# Patient Record
Sex: Male | Born: 2000 | Race: White | Hispanic: No | Marital: Single | Smoking: Never smoker
Health system: Southern US, Community
[De-identification: ages and names within clinical notes are randomized; demographics above are authoritative.]

## PROBLEM LIST (undated history)

## (undated) DIAGNOSIS — J45909 Unspecified asthma, uncomplicated: Secondary | ICD-10-CM

## (undated) DIAGNOSIS — R51 Headache: Secondary | ICD-10-CM

## (undated) DIAGNOSIS — G43909 Migraine, unspecified, not intractable, without status migrainosus: Secondary | ICD-10-CM

## (undated) DIAGNOSIS — K219 Gastro-esophageal reflux disease without esophagitis: Secondary | ICD-10-CM

## (undated) DIAGNOSIS — R109 Unspecified abdominal pain: Secondary | ICD-10-CM

## (undated) DIAGNOSIS — K589 Irritable bowel syndrome without diarrhea: Secondary | ICD-10-CM

## (undated) HISTORY — DX: Headache: R51

## (undated) HISTORY — DX: Migraine, unspecified, not intractable, without status migrainosus: G43.909

## (undated) HISTORY — DX: Unspecified asthma, uncomplicated: J45.909

## (undated) HISTORY — PX: NO PAST SURGERIES: SHX2092

## (undated) HISTORY — DX: Unspecified abdominal pain: R10.9

## (undated) HISTORY — DX: Irritable bowel syndrome, unspecified: K58.9

---
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
## 2018-02-11 ENCOUNTER — Ambulatory Visit: Payer: 59 | Admitting: Neurology

## 2018-02-11 ENCOUNTER — Encounter: Payer: Self-pay | Admitting: Neurology

## 2018-02-11 VITALS — BP 112/73 | HR 80 | Ht 73.0 in | Wt 175.4 lb

## 2018-02-11 DIAGNOSIS — G43009 Migraine without aura, not intractable, without status migrainosus: Secondary | ICD-10-CM

## 2018-02-11 MED ORDER — TIZANIDINE HCL 4 MG PO TABS: 4.0000 mg | ORAL_TABLET | Freq: Four times a day (QID) | ORAL | 6 refills | Status: DC | PRN

## 2018-02-11 MED ORDER — BUTALBITAL-APAP-CAFFEINE 50-325-40 MG PO TABS: 2.0000 | ORAL_TABLET | Freq: Four times a day (QID) | ORAL | 3 refills | Status: DC | PRN

## 2018-02-11 MED ORDER — ONDANSETRON 4 MG PO TBDP: 4.0000 mg | ORAL_TABLET | Freq: Three times a day (TID) | ORAL | 3 refills | Status: DC | PRN

## 2018-02-11 NOTE — Patient Instructions (Addendum)
Tizanidine up to 3x a day for migraine or neck spasms. Or may take at bedtime every night. Zofran (ondansetron) for nausea or migraine Fioricet at onset of migraine  Ondansetron tablets What is this medicine? ONDANSETRON (on DAN se tron) is used to treat nausea and vomiting caused by chemotherapy. It is also used to prevent or treat nausea and vomiting after surgery. This medicine may be used for other purposes; ask your health care provider or pharmacist if you have questions. COMMON BRAND NAME(S): Zofran What should I tell my health care provider before I take this medicine? They need to know if you have any of these conditions: -heart disease -history of irregular heartbeat -liver disease -low levels of magnesium or potassium in the blood -an unusual or allergic reaction to ondansetron, granisetron, other medicines, foods, dyes, or preservatives -pregnant or trying to get pregnant -breast-feeding How should I use this medicine? Take this medicine by mouth with a glass of water. Follow the directions on your prescription label. Take your doses at regular intervals. Do not take your medicine more often than directed. Talk to your pediatrician regarding the use of this medicine in children. Special care may be needed. Overdosage: If you think you have taken too much of this medicine contact a poison control center or emergency room at once. NOTE: This medicine is only for you. Do not share this medicine with others. What if I miss a dose? If you miss a dose, take it as soon as you can. If it is almost time for your next dose, take only that dose. Do not take double or extra doses. What may interact with this medicine? Do not take this medicine with any of the following medications: -apomorphine -certain medicines for fungal infections like fluconazole, itraconazole, ketoconazole, posaconazole,  voriconazole -cisapride -dofetilide -dronedarone -pimozide -thioridazine -ziprasidone This medicine may also interact with the following medications: -carbamazepine -certain medicines for depression, anxiety, or psychotic disturbances -fentanyl -linezolid -MAOIs like Carbex, Eldepryl, Marplan, Nardil, and Parnate -methylene blue (injected into a vein) -other medicines that prolong the QT interval (cause an abnormal heart rhythm) -phenytoin -rifampicin -tramadol This list may not describe all possible interactions. Give your health care provider a list of all the medicines, herbs, non-prescription drugs, or dietary supplements you use. Also tell them if you smoke, drink alcohol, or use illegal drugs. Some items may interact with your medicine. What should I watch for while using this medicine? Check with your doctor or health care professional right away if you have any sign of an allergic reaction. What side effects may I notice from receiving this medicine? Side effects that you should report to your doctor or health care professional as soon as possible: -allergic reactions like skin rash, itching or hives, swelling of the face, lips or tongue -breathing problems -confusion -dizziness -fast or irregular heartbeat -feeling faint or lightheaded, falls -fever and chills -loss of balance or coordination -seizures -sweating -swelling of the hands or feet -tightness in the chest -tremors -unusually weak or tired Side effects that usually do not require medical attention (report to your doctor or health care professional if they continue or are bothersome): -constipation or diarrhea -headache This list may not describe all possible side effects. Call your doctor for medical advice about side effects. You may report side effects to FDA at 1-800-FDA-1088. Where should I keep my medicine? Keep out of the reach of children. Store between 2 and 30 degrees C (36 and 86 degrees F).  Throw away any  unused medicine after the expiration date. NOTE: This sheet is a summary. It may not cover all possible information. If you have questions about this medicine, talk to your doctor, pharmacist, or health care provider.  2018 [employer]vier/Gold Standard (2012-12-17 16:27:45)    Tizanidine tablets or capsules What is this medicine? TIZANIDINE (tye ZAN i deen) helps to relieve muscle spasms. It may be used to help in the treatment of multiple sclerosis and spinal cord injury. This medicine may be used for other purposes; ask your health care provider or pharmacist if you have questions. COMMON BRAND NAME(S): Zanaflex What should I tell my health care provider before I take this medicine? They need to know if you have any of these conditions: -kidney disease -liver disease -low blood pressure -mental disorder -an unusual or allergic reaction to tizanidine, other medicines, lactose (tablets only), foods, dyes, or preservatives -pregnant or trying to get pregnant -breast-feeding How should I use this medicine? Take this medicine by mouth with a full glass of water. Take this medicine on an empty stomach, at least 30 minutes before or 2 hours after food. Do not take with food unless you talk with your doctor. Follow the directions on the prescription label. Take your medicine at regular intervals. Do not take your medicine more often than directed. Do not stop taking except on your doctor's advice. Suddenly stopping the medicine can be very dangerous. Talk to your pediatrician regarding the use of this medicine in children. Patients over 26 years old may have a stronger reaction and need a smaller dose. Overdosage: If you think you have taken too much of this medicine contact a poison control center or emergency room at once. NOTE: This medicine is only for you. Do not share this medicine with others. What if I miss a dose? If you miss a dose, take it as soon as you can. If it is almost  time for your next dose, take only that dose. Do not take double or extra doses. What may interact with this medicine? Do not take this medicine with any of the following medications: -ciprofloxacin -cisapride -dofetilide -dronedarone -fluvoxamine -narcotic medicines for cough -pimozide -thiabendazole -thioridazine -ziprasidone This medicine may also interact with the following medications: -acyclovir -alcohol -antihistamines for allergy, cough and cold -baclofen -certain antibiotics like levofloxacin, ofloxacin -certain medicines for anxiety or sleep -certain medicines for blood pressure, heart disease, irregular heart beat -certain medicines for depression like amitriptyline, fluoxetine, sertraline -certain medicines for seizures like phenobarbital, primidone -certain medicines for stomach problems like cimetidine, famotidine -male hormones, like estrogens or progestins and birth control pills, patches, rings, or injections -general anesthetics like halothane, isoflurane, methoxyflurane, propofol -local anesthetics like lidocaine, pramoxine, tetracaine -medicines that relax muscles for surgery -narcotic medicines for pain -other medicines that prolong the QT interval (cause an abnormal heart rhythm) -phenothiazines like chlorpromazine, mesoridazine, prochlorperazine -ticlopidine -zileuton This list may not describe all possible interactions. Give your health care provider a list of all the medicines, herbs, non-prescription drugs, or dietary supplements you use. Also tell them if you smoke, drink alcohol, or use illegal drugs. Some items may interact with your medicine. What should I watch for while using this medicine? Tell your doctor or health care professional if your symptoms do not start to get better or if they get worse. You may get drowsy or dizzy. Do not drive, use machinery, or do anything that needs mental alertness until you know how this medicine affects you. Do  not stand or sit up  quickly, especially if you are an older patient. This reduces the risk of dizzy or fainting spells. Alcohol may interfere with the effect of this medicine. Avoid alcoholic drinks. If you are taking another medicine that also causes drowsiness, you may have more side effects. Give your health care provider a list of all medicines you use. Your doctor will tell you how much medicine to take. Do not take more medicine than directed. Call emergency for help if you have problems breathing or unusual sleepiness. Your mouth may get dry. Chewing sugarless gum or sucking hard candy, and drinking plenty of water may help. Contact your doctor if the problem does not go away or is severe. What side effects may I notice from receiving this medicine? Side effects that you should report to your doctor or health care professional as soon as possible: -allergic reactions like skin rash, itching or hives, swelling of the face, lips, or tongue -breathing problems -hallucinations -signs and symptoms of liver injury like dark yellow or brown urine; general ill feeling or flu-like symptoms; light-colored stools; loss of appetite; nausea; right upper quadrant belly pain; unusually weak or tired; yellowing of the eyes or skin -signs and symptoms of low blood pressure like dizziness; feeling faint or lightheaded, falls; unusually weak or tired -unusually slow heartbeat -unusually weak or tired Side effects that usually do not require medical attention (report to your doctor or health care professional if they continue or are bothersome): -blurred vision -constipation -dizziness -dry mouth -tiredness This list may not describe all possible side effects. Call your doctor for medical advice about side effects. You may report side effects to FDA at 1-800-FDA-1088. Where should I keep my medicine? Keep out of the reach of children. Store at room temperature between 15 and 30 degrees C (59 and 86 degrees  F). Throw away any unused medicine after the expiration date. NOTE: This sheet is a summary. It may not cover all possible information. If you have questions about this medicine, talk to your doctor, pharmacist, or health care provider.  2018 [employer]vier/Gold Standard (2014-12-21 13:52:12)

## 2018-02-11 NOTE — Progress Notes (Signed)
[facility]    Provider:  Dr Lucia Gaskins Referring Provider: Nonnie Done., MD Primary Care Physician:  Nonnie Done., MD  CC: Migraines  Interval history 02/11/2018: Patient is here with his mother.  Mother provides much information.  Patient has migraines.  Patient felt the nortriptyline made his migraines worse.  He had a reaction to atrophy or possibly just anxiety, he does not like needles, he declines any further CGRP's or Botox or any other medications or procedures involving needles.  He also had a very bad reaction to the Imitrex and does not want to take triptans anymore.  I gave him a limited supply of Fioricet, discussed medication overuse headache.  He feels his headache frequency is not bad enough to try another preventative, discussed likely topiramate would be the next thing we could try or Zonegran since his mom had a bad reaction to topiramate for her migraines.  At this point he is okay as is with the Fioricet as needed.  HPI:  Stephen Bass is a 17 y.o. male here as a referral from Dr. Egbert Garibaldi for migraines. PMHx migraines. Here with his mother who provides information. The headaches started after the concussions. He hit his head on the back left. Twice while playing basketball. There was some confusion but no loss of consciousness and quickly resolved. Last was 03/2015 and he started having migraines, started in the back of the head, like a hammer, pounding, pulsating not necessarily burning or shooting or tingling, light and sound sensitivity, nausea, his muscles in the back of his neck hurts but later diagnosed as swelled lymph nodes. They have lasted up to 2 days. Average >4 hours. Sensitivity tot he skull. Unknown triggers, have kept a migraine diary and no patterns. 15 headache days a month. 8 migrainous. They are acute and severe. No Aura. He will get associated finger numbness. He goes to [facility]. Impairs his life. Maybe a trigger is neck position looking  down a lot neck muscles hurt. Sleep helps.  maxalt doesn't help. He takes 2 ibuprofen, a benadryl, and a nausea pill. No medication overuse.   Reviewed notes, labs and imaging from outside physicians, which showed:  Reviewed referring physician notes.  17 year old presents complaining of headache with history of headaches.  Last headache lasted 3 days and was 1 month ago.  He has been on visit trip 10 prescribed by his neurologist but he says it is not helped his headaches.  He has no aura or photophobia no visual changes he has photophobia headache is throbbing pounding always occurs in the occipital area.  He has no premonition of headache.  Asked for a new neurologist.  Medications tried: Topiramate which increased headaches, Flexeril, ibuprofen, Excedrin, Benadryl, Phenergan, Maxalt  Reviewed neurologist notes.  Patient was seeing Dr. Clelia Croft.  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  They performed occipital nerve blocks.  It appears that they have recommended Maxalt in the past and magnesium.  The headache started in 2007 was pounding, squeezing, throbbing, constant pressure, exploding pain in the occipital region.  He gets light sensitivity and noise sensitivity, headaches reach its worst after onset quickly.  Headaches may last a variable time.  Worsens with physical activity.  Activity triggers the headaches.  Sleeping in a dark room helps.  Patient is seen Dr. Sharene Skeans for symptoms.  Patient has been to the emergency room for this.  He does not suffer from depression, obstructive sleep apnea, obesity.  He  does suffer from anxiety.  He has a history of head trauma and concussions.  Last concussion was in January 2017.  Since then his headaches have worsened and are more frequent.  He sleeps from 10-8 nightly.  Family history of headaches in the mother.  Patient tried meloxicam as well.  MRI brain 08/2015: showed No acute intracranial abnormalities including  mass lesion or mass effect, hydrocephalus, extra-axial fluid collection, midline shift, hemorrhage, or acute infarction, large ischemic events (personally reviewed images)    Review of Systems: Patient complains of symptoms per HPI as well as the following symptoms:Headache, numbness, weakness . Pertinent negatives and positives per HPI. All others negative.   Social History   Socioeconomic History  . Marital status: Single    Spouse name: Not on file  . Number of children: Not on file  . Years of education: Archivist   . Highest education level: Some college, no degree  Occupational History  . Not on file  Social Needs  . Financial resource strain: Not on file  . Food insecurity:    Worry: Not on file    Inability: Not on file  . Transportation needs:    Medical: Not on file    Non-medical: Not on file  Tobacco Use  . Smoking status: Never Smoker  . Smokeless tobacco: Never Used  Substance and Sexual Activity  . Alcohol use: No  . Drug use: No  . Sexual activity: Never  Lifestyle  . Physical activity:    Days per week: Not on file    Minutes per session: Not on file  . Stress: Not on file  Relationships  . Social connections:    Talks on phone: Not on file    Gets together: Not on file    Attends religious service: Not on file    Active member of club or organization: Not on file    Attends meetings of clubs or organizations: Not on file    Relationship status: Not on file  . Intimate partner violence:    Fear of current or ex partner: Not on file    Emotionally abused: Not on file    Physically abused: Not on file    Forced sexual activity: Not on file  Other Topics Concern  . 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 He lives with his parents and sibling. He enjoys basketball and running.   Caffeine: 2 cups of tea daily   Right handed    Family History  Problem Relation Age of Onset  . COPD Paternal  Grandfather        Died at the age of 39  . Migraines Mother   . Endometriosis Mother   . Hyperlipidemia Father   . Migraines Paternal Grandmother     Past Medical History:  Diagnosis Date  . Abdominal pain   . Acid reflux   . Asthma   . Headache(784.0)   . IBS (irritable bowel syndrome)   . Migraine     Past Surgical History:  Procedure Laterality Date  . NO PAST SURGERIES    . WISDOM TOOTH EXTRACTION Bilateral 10/24/2017    Current Outpatient Medications  Medication Sig Dispense Refill  . albuterol (PROVENTIL HFA;VENTOLIN HFA) 108 (90 Base) MCG/ACT inhaler Inhale 2 puffs into the lungs. Use as needed for exercise induced asthma     . butalbital-acetaminophen-caffeine (FIORICET, ESGIC) 50-325-40 MG tablet Take 2 tablets by mouth every  6 (six) hours as needed for headache. 20 tablet 3  . dicyclomine (BENTYL) 10 MG capsule Take 10 mg by mouth 3 (three) times daily as needed.    . fexofenadine (ALLEGRA) 60 MG tablet Take 60 mg by mouth daily.    Marland Kitchen eletriptan (RELPAX) 40 MG tablet Take 1 tablet (40 mg total) by mouth as needed for migraine or headache. Repeat in 2 hours if needed. Max twice in one day. (Patient not taking: Reported on 02/11/2018) 10 tablet 11  . nortriptyline (PAMELOR) 10 MG capsule Take 1 capsule (10 mg total) by mouth at bedtime. (Patient not taking: Reported on 02/11/2018) 30 capsule 11  . ondansetron (ZOFRAN-ODT) 4 MG disintegrating tablet Take 1 tablet (4 mg total) by mouth every 8 (eight) hours as needed for nausea. 30 tablet 3  . tiZANidine (ZANAFLEX) 4 MG tablet Take 1 tablet (4 mg total) by mouth every 6 (six) hours as needed for muscle spasms. 90 tablet 6   No current facility-administered medications for this visit.     Allergies as of 02/11/2018 - Review Complete 02/11/2018  Allergen Reaction Noted  . Amoxicillin-pot clavulanate Diarrhea 03/23/2013    Vitals: BP 112/73   Pulse 80   Ht 6\' 1"  (1.854 m)   Wt 175 lb 6.4 oz (79.6 kg)   BMI 23.14  kg/m  Last Weight:  Wt Readings from Last 1 Encounters:  02/11/18 175 lb 6.4 oz (79.6 kg) (79 %, Z= 0.80)*   * Growth percentiles are based on CDC (Boys, 2-20 Years) data.   Last Height:   Ht Readings from Last 1 Encounters:  02/11/18 6\' 1"  (1.854 m) (89 %, Z= 1.24)*   * Growth percentiles are based on CDC (Boys, 2-20 Years) data.   Physical exam: Exam: Gen: NAD, conversant, well nourised, well groomed                     CV: RRR, no MRG. No Carotid Bruits. No peripheral edema, warm, nontender Eyes: Conjunctivae clear without exudates or hemorrhage  Neuro: Detailed Neurologic Exam  Speech:    Speech is normal; fluent and spontaneous with normal comprehension.  Cognition:    The patient is oriented to person, place, and time;     recent and remote memory intact;     language fluent;     normal attention, concentration,     fund of knowledge Cranial Nerves:    The pupils are equal, round, and reactive to light. The fundi are normal and spontaneous venous pulsations are present. Visual fields are full to finger confrontation. Extraocular movements are intact. Trigeminal sensation is intact and the muscles of mastication are normal. The face is symmetric. The palate elevates in the midline. Hearing intact. Voice is normal. Shoulder shrug is normal. The tongue has normal motion without fasciculations.   Coordination:    Normal finger to nose and heel to shin. Normal rapid alternating movements.   Gait:    Heel-toe and tandem gait are normal.   Motor Observation:    No asymmetry, no atrophy, and no involuntary movements noted. Tone:    Normal muscle tone.    Posture:    Posture is normal. normal erect    Strength:    Strength is V/V in the upper and lower limbs.      Sensation: intact to LT     Reflex Exam:  DTR's:    Deep tendon reflexes in the upper and lower extremities are normal bilaterally.   Toes:  The toes are downgoing bilaterally.   Clonus:    Clonus  is absent.       Assessment/Plan:  Ptient with migraines. Had a long discussion about migraine management.    - Patient felt the nortriptyline made his migraines worse.   - He had a reaction to atrophy or possibly just anxiety, he does not like needles, he declines any further CGRP's or Botox or any other medications or procedures involving needles.   - He also had side effects to the Imitrex and maxalt and does not want to take triptans anymore. - Fioricet helps. I gave him a limited monthly supply of Fioricet, discussed medication overuse headache.   - He feels his headache frequency is not bad enough to try another preventative, discussed likely topiramate would be the next thing we could try or Zonegran since his mom had a bad reaction to topiramate for her migraines.   - At this point he is okay as is with the Fioricet as needed. -Headaches are often occipital, started after concussion, discussed the occipital nerve and its role in headaches and migraines.  At some point could try nerve blocks however patient declines anything that involves a needle. - Tizanidine up to 3x a day for migraine or neck spasms, neck muscle pain which he has. Or may take at bedtime every night. Discussed stretching, good posture. They decline PT. - Zofran (ondansetron) for nausea or migraine  Meds ordered this encounter  Medications  . tiZANidine (ZANAFLEX) 4 MG tablet    Sig: Take 1 tablet (4 mg total) by mouth every 6 (six) hours as needed for muscle spasms.    Dispense:  90 tablet    Refill:  6  . butalbital-acetaminophen-caffeine (FIORICET, ESGIC) 50-325-40 MG tablet    Sig: Take 2 tablets by mouth every 6 (six) hours as needed for headache.    Dispense:  20 tablet    Refill:  3    Do not fill early  . ondansetron (ZOFRAN-ODT) 4 MG disintegrating tablet    Sig: Take 1 tablet (4 mg total) by mouth every 8 (eight) hours as needed for nausea.    Dispense:  30 tablet    Refill:  3     Again,  Discussed: To prevent or relieve headaches, try the following: Cool Compress. Lie down and place a cool compress on your head.  Avoid headache triggers. If certain foods or odors seem to have triggered your migraines in the past, avoid them. A headache diary might help you identify triggers.  Include physical activity in your daily routine. Try a daily walk or other moderate aerobic exercise.  Manage stress. Find healthy ways to cope with the stressors, such as delegating tasks on your to-do list.  Practice relaxation techniques. Try deep breathing, yoga, massage and visualization.  Eat regularly. Eating regularly scheduled meals and maintaining a healthy diet might help prevent headaches. Also, drink plenty of fluids.  Follow a regular sleep schedule. Sleep deprivation might contribute to headaches Consider biofeedback. With this mind-body technique, you learn to control certain bodily functions - such as muscle tension, heart rate and blood pressure - to prevent headaches or reduce headache pain.    Proceed to emergency room if you experience new or worsening symptoms or symptoms do not resolve, if you have new neurologic symptoms or if headache is severe, or for any concerning symptom.   Provided education and documentation from American headache Society toolbox including articles on: chronic migraine medication overuse headache, chronic  migraines, prevention of migraines, behavioral and other nonpharmacologic treatments for headache.  A total of 25 minutes was spent face-to-face with this patient. Over half this time was spent on counseling patient on the  1. Migraine without aura and without status migrainosus, not intractable     diagnosis and different diagnostic and therapeutic options, counseling and coordination of care, risks ans benefits of management, compliance, or risk factor reduction and education.       Naomie DeanAntonia Delenn Ahn, MD  [facility] [address]  [address] [place], [place] [id number]  Phone [phone] Fax [phone]

## 2018-09-16 ENCOUNTER — Telehealth: Payer: Self-pay | Admitting: Neurology

## 2018-09-16 ENCOUNTER — Encounter: Payer: Self-pay | Admitting: Neurology

## 2018-09-16 NOTE — Telephone Encounter (Signed)
Pts mother Farooq Petrovich called in and stated pt is having side effects from the butalbital-acetaminophen-caffeine (FIORICET, ESGIC) 50-325-40 MG tablet, she states he had a episode where he wet the bed due to being out of it, and also has been experiencing some dizziness .

## 2018-09-16 NOTE — Telephone Encounter (Signed)
Stop the fioricet. Call the pharmacy and tell them not to refill.  Set up a video appointment with amy.

## 2018-09-16 NOTE — Telephone Encounter (Signed)
Spoke with pt's mother. She stated pt has lost 20 lbs. The last time he took the [place] I spoke with the pt.  He stated the last three times he has taken the Fioricet he has had side effects but had only been taking 1 tablet every 6 hours as needed. He stated he took it on Friday and then repeated the dose around 11 pm that night and the s/e got worse. S/e have resolved. They lasted for about a whole day.

## 2018-09-16 NOTE — Telephone Encounter (Signed)
Spoke with Cassandra @ [facility]. Fioricet canceled.   Spoke with pt and discussed plan to stop Fioricet. Discussed doing a VV. Patient agreed. Pt understands that although there may be some limitations with this type of visit, we will take all precautions to reduce any security or privacy concerns. Pt understands that this will be treated like an in office visit and we will file with pt's insurance, and there may be a patient responsible charge related to this service. Scheduled pt for mychart VV on Wed 6/24 @ 3:30 pm. His questions were answered. He understands the process for joining meeting. He also confirmed his DOB and provided updates to chart. No changes to history. He verbalized appreciation for the call.

## 2018-09-17 ENCOUNTER — Other Ambulatory Visit: Payer: Self-pay

## 2018-09-17 ENCOUNTER — Encounter: Payer: Self-pay | Admitting: Family Medicine

## 2018-09-17 ENCOUNTER — Telehealth (INDEPENDENT_AMBULATORY_CARE_PROVIDER_SITE_OTHER): Payer: 59 | Admitting: Family Medicine

## 2018-09-17 DIAGNOSIS — G43009 Migraine without aura, not intractable, without status migrainosus: Secondary | ICD-10-CM | POA: Diagnosis not present

## 2018-09-17 MED ORDER — UBRELVY 50 MG PO TABS: 50.0000 mg | ORAL_TABLET | Freq: Every day | ORAL | 11 refills | Status: DC | PRN

## 2018-09-17 NOTE — Progress Notes (Addendum)
PATIENT: Stephen Bass DOB: 07/30/2000  REASON FOR VISIT: follow up HISTORY FROM: patient  Virtual Visit via Telephone Note  I connected with Stephen Bass on 09/17/18 at  3:30 PM EDT by telephone and verified that I am speaking with the correct person using two identifiers.   I discussed the limitations, risks, security and privacy concerns of performing an evaluation and management service by telephone and the availability of in person appointments. I also discussed with the patient that there may be a patient responsible charge related to this service. The patient expressed understanding and agreed to proceed.   History of Present Illness:  09/17/18 Stephen Bass is a 18 y.o. male here today for follow up of migraines.  Melvenia BeamSimon reports that he has had some concerns with Fioricet causing side effects.  He states that about 3 to 4 weeks ago he took 1 tablet and he felt dizzy and groggy following.  About 2 weeks ago he took 1 tablet in the morning and repeated 1 tablet in the evening.  He states that he was extremely groggy and had an episode of incontinence while sleeping.  He is concerned about the side effects with Fioricet.  He reports that his migraines are occurring about once monthly.  He is tried multiple other abortive therapies including Imitrex (bad reaction) Relpax (ineffective), Maxalt (ineffective), Zofran, Phenergan and Flexeril. He has taken topiramate for prevention which increased frequency of migraines. He is making healthy lifestyle changes and has cut out simple sugars. He has lost 20 pounds.    History (copied from Dr Trevor MaceAhern's note on 02/11/2018)  HPI:  Stephen Bass is a 18 y.o. male here as a referral from Dr. Egbert GaribaldiSlatosky for migraines. PMHx migraines. Here with his mother who provides information. The headaches started after the concussions. He hit his head on the back left. Twice while playing basketball. There was some confusion but no loss of consciousness and quickly  resolved. Last was 03/2015 and he started having migraines, started in the back of the head, like a hammer, pounding, pulsating not necessarily burning or shooting or tingling, light and sound sensitivity, nausea, his muscles in the back of his neck hurts but later diagnosed as swelled lymph nodes. They have lasted up to 2 days. Average >4 hours. Sensitivity tot he skull. Unknown triggers, have kept a migraine diary and no patterns. 15 headache days a month. 8 migrainous. They are acute and severe. No Aura. He will get associated finger numbness. [facility]. Impairs his life. Maybe a trigger is neck position looking down a lot neck muscles hurt. Sleep helps.  maxalt doesn't help. He takes 2 ibuprofen, a benadryl, and a nausea pill. No medication overuse.   Reviewed notes, labs and imaging from outside physicians, which showed:  Reviewed referring physician notes.  18 year old presents complaining of headache with history of headaches.  Last headache lasted 3 days and was 1 month ago.  He has been on visit trip 10 prescribed by his neurologist but he says it is not helped his headaches.  He has no aura or photophobia no visual changes he has photophobia headache is throbbing pounding always occurs in the occipital area.  He has no premonition of headache.  Asked for a new neurologist.  Medications tried: Topiramate which increased headaches, Flexeril, ibuprofen, Excedrin, Benadryl, Phenergan, Maxalt  Reviewed neurologist notes.  Patient was seeing Dr. Clelia CroftShaw.  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  They performed occipital  nerve blocks.  It appears that they have recommended Maxalt in the past and magnesium.  The headache started in 2007 was pounding, squeezing, throbbing, constant pressure, exploding pain in the occipital region.  He gets light sensitivity and noise sensitivity, headaches reach its worst after onset quickly.  Headaches may last a variable  time.  Worsens with physical activity.  Activity triggers the headaches.  Sleeping in a dark room helps.  Patient is seen Dr. Gaynell Face for symptoms.  Patient has been to the emergency room for this.  He does not suffer from depression, obstructive sleep apnea, obesity.  He does suffer from anxiety.  He has a history of head trauma and concussions.  Last concussion was in January 2017.  Since then his headaches have worsened and are more frequent.  He sleeps from 10-8 nightly.  Family history of headaches in the mother.  Patient tried meloxicam as well.  MRI brain 08/2015: showed No acute intracranial abnormalities including mass lesion or mass effect, hydrocephalus, extra-axial fluid collection, midline shift, hemorrhage, or acute infarction, large ischemic events (personally reviewed images)     Observations/Objective:  Generalized: Well developed, in no acute distress  Mentation: Alert oriented to time, place, history taking. Follows all commands speech and language fluent   Assessment and Plan:  18 y.o. year old male  has a past medical history of Abdominal pain, Acid reflux, Asthma, Headache(784.0), IBS (irritable bowel syndrome), and Migraine. here with    ICD-10-CM   1. Migraine without aura and without status migrainosus, not intractable  G43.009 Ubrogepant (UBRELVY) 50 MG TABS   Unfortunately Joandry was having adverse effects with Fioricet.  We have discontinued this medication as well as notified the pharmacy not to refill in the future.  We will start Ubrelvy 50mg  for abortive therapy.  He and his mother were both instructed on proper administration.  He may take 1 tablet at the onset of a migraine.  May repeat 1 tablet in 2 hours but no more than 2 tablets in 24 hours.  I do not feel that a preventative medication is needed at this time.  I have encouraged him to continue healthy lifestyle changes.  We will follow-up annually, sooner if needed.  He and his mother both verbalized  understanding and agreement with this plan.  No orders of the defined types were placed in this encounter.   Meds ordered this encounter  Medications  . Ubrogepant (UBRELVY) 50 MG TABS    Sig: Take 50 mg by mouth daily as needed. Take one tablet at onset of headache, may repeat 1 tablet in 2 hours, no more than 2 tablets in 24 hours    Dispense:  10 tablet    Refill:  11    Order Specific Question:   Supervising Provider    Answer:   Melvenia Beam [[id number]]     Follow Up Instructions:  I discussed the assessment and treatment plan with the patient. The patient was provided an opportunity to ask questions and all were answered. The patient agreed with the plan and demonstrated an understanding of the instructions.   The patient was advised to call back or seek an in-person evaluation if the symptoms worsen or if the condition fails to improve as anticipated.  I provided 15 minutes of non-face-to-face time during this encounter.  Patient and his mother are located at their place of residence during my chart visit.  Provider is located in the office.  Maryelizabeth Kaufmann, CMA helped to facilitate  visit.   Shawnie DapperAmy Jancie Kercher, NP

## 2018-09-17 NOTE — Progress Notes (Signed)
Made any corrections needed, and agree with history, physical, neuro exam,assessment and plan as stated.     Antonia Ahern, MD [facility]  

## 2018-09-18 ENCOUNTER — Telehealth: Payer: Self-pay

## 2018-09-18 NOTE — Telephone Encounter (Signed)
Pending approval for Ubrelvy 50 mg tablets Key: [id number]   I will update once a decision has been made.

## 2018-09-22 NOTE — Telephone Encounter (Signed)
Roselyn Meier has been denied. Appeal has been started on patient's behald via covermymeds. Key: [id number]. I will update once a decision has been made.

## 2018-09-30 ENCOUNTER — Telehealth: Payer: Self-pay | Admitting: Family Medicine

## 2018-10-06 NOTE — Telephone Encounter (Signed)
Spoke with [place] at [facility]. They had to order the [place]. He also confirmed the savings card should work. I advised it should let him get 10 tablets for $10 per month.

## 2018-10-15 NOTE — Telephone Encounter (Signed)
[facility]oke with [facility]. They have attempted again to run the savings card for the remaining 6 tablets and it will not work. I will call the number on the savings card.

## 2018-10-15 NOTE — Telephone Encounter (Signed)
I spoke with [place] @ the [facility]. Unfortunately since pt's insurance only allows 4, it puts up a block so the card cannot be run for more than 4 tablets. They will always have to run the med through insurance first before using the card so unless insurance allows more than 4 or they can override a PA then he cannot get 10.

## 2018-10-17 ENCOUNTER — Telehealth: Payer: Self-pay | Admitting: Neurology

## 2018-10-17 MED ORDER — BUTALBITAL-APAP-CAFFEINE 50-325-40 MG PO TABS: 1.0000 | ORAL_TABLET | Freq: Four times a day (QID) | ORAL | 0 refills | Status: DC | PRN

## 2018-10-17 NOTE — Telephone Encounter (Signed)
Patient's mother called, he has frequent migraine, used up 4 tabs of ubrelvy monthly supply. He did not respond to triptan treatment in the past.  I called Fioricet 10 tab for 30 days, He did responded to [place], but complains the side effect of dizziness.

## 2019-02-02 ENCOUNTER — Ambulatory Visit: Payer: Self-pay

## 2019-02-02 ENCOUNTER — Ambulatory Visit (INDEPENDENT_AMBULATORY_CARE_PROVIDER_SITE_OTHER): Payer: 59 | Admitting: Orthopedic Surgery

## 2019-02-02 ENCOUNTER — Encounter: Payer: Self-pay | Admitting: Orthopedic Surgery

## 2019-02-02 VITALS — Ht 73.0 in | Wt 175.0 lb

## 2019-02-02 DIAGNOSIS — M25531 Pain in right wrist: Secondary | ICD-10-CM | POA: Diagnosis not present

## 2019-02-03 ENCOUNTER — Encounter: Payer: Self-pay | Admitting: Orthopedic Surgery

## 2019-02-03 NOTE — Progress Notes (Signed)
Office Visit Note   Patient: Stephen Bass           Date of Birth: 11-27-2000           MRN: [id number] Visit Date: 02/02/2019              Requested by: Nonnie Done., MD [address] [place],  [place] [address] PCP: Nonnie Done., MD  Chief Complaint  Patient presents with  . Right Wrist - Pain      HPI: Patient is a 18 year old gentleman who states he landed on the lateral side of the right wrist about 2 weeks ago.  He is currently wearing a Velcro splint states he has swelling and pain primarily over the ulnar aspect of the wrist.  Assessment & Plan: Visit Diagnoses:  1. Pain in right wrist     Plan: Discussed patient has a contusion to his TFCC recommended he use the splint for support and comfort over-the-counter anti-inflammatories follow-up as needed  Follow-Up Instructions: Return if symptoms worsen or fail to improve.   Ortho Exam  Patient is alert, oriented, no adenopathy, well-dressed, normal affect, normal respiratory effort. Examination patient has good pulses he has good flexion extension of all digits.  The scaphoid and scapholunate interval are nontender to palpation.  Patient is point tender to palpation over the TFCC ulnar grind reproduces his pain.  Imaging: No results found. No images are attached to the encounter.  Labs: Lab Results  Component Value Date   GRAMSTAIN No WBC Seen 08/06/2012   GRAMSTAIN No Squamous Epithelial Cells Seen 08/06/2012   GRAMSTAIN Abundant Gram Negative Rods 08/06/2012   GRAMSTAIN Moderate Gram Positive Rods 08/06/2012   GRAMSTAIN Rare Yeast 08/06/2012     Lab Results  Component Value Date   ALBUMIN 4.7 07/05/2017   ALBUMIN 4.4 07/29/2012    No results found for: MG No results found for: VD25OH  No results found for: PREALBUMIN CBC EXTENDED Latest Ref Rng & Units 07/05/2017 07/29/2012  WBC 3.4 - 10.8 x10E3/uL 5.2 6.9  RBC 4.14 - 5.80 x10E6/uL 4.92 4.76  HGB 13.0 - 17.7 g/dL 36.4 68.0  HCT 32.1 - 22.4  % 43.0 37.9  PLT 150 - 379 x10E3/uL 263 284  NEUTROABS 1.5 - 8.0 K/uL - 3.0  LYMPHSABS 1.5 - 7.5 K/uL - 2.9     Body mass index is 23.09 kg/m.  Orders:  Orders Placed This Encounter  Procedures  . XR Wrist 2 Views Right   No orders of the defined types were placed in this encounter.    Procedures: No procedures performed  Clinical Data: No additional findings.  ROS:  All other systems negative, except as noted in the HPI. Review of Systems  Objective: Vital Signs: Ht 6\' 1"  (1.854 m)   Wt 175 lb (79.4 kg)   BMI 23.09 kg/m   Specialty Comments:  No specialty comments available.  PMFS History: Patient Active Problem List   Diagnosis Date Noted  . Concussion with no loss of consciousness 04/01/2015  . Episodic tension-type headache, not intractable 04/01/2015  . Asthma, exercise induced 04/01/2015  . Migraine without aura and without status migrainosus, not intractable 02/09/2015  . Nausea alone 08/06/2012  . Diarrhea 08/06/2012  . Generalized abdominal pain    Past Medical History:  Diagnosis Date  . Abdominal pain   . Acid reflux   . Asthma   . Headache(784.0)   . IBS (irritable bowel syndrome)   . Migraine  Family History  Problem Relation Age of Onset  . COPD Paternal Grandfather        Died at the age of 43  . Migraines Mother   . Endometriosis Mother   . Hyperlipidemia Father   . Migraines Paternal Grandmother     Past Surgical History:  Procedure Laterality Date  . NO PAST SURGERIES    . WISDOM TOOTH EXTRACTION Bilateral 10/24/2017   Social History   Occupational History  . Not on file  Tobacco Use  . Smoking status: Never Smoker  . Smokeless tobacco: Never Used  Substance and Sexual Activity  . Alcohol use: No  . Drug use: No  . Sexual activity: Never

## 2019-10-02 ENCOUNTER — Other Ambulatory Visit: Payer: Self-pay | Admitting: Neurology

## 2019-10-02 DIAGNOSIS — G43009 Migraine without aura, not intractable, without status migrainosus: Secondary | ICD-10-CM

## 2019-10-02 MED ORDER — UBRELVY 50 MG PO TABS: 50.0000 mg | ORAL_TABLET | Freq: Every day | ORAL | 11 refills | Status: DC | PRN

## 2019-11-12 ENCOUNTER — Telehealth: Payer: Self-pay | Admitting: Family Medicine

## 2019-11-12 DIAGNOSIS — G43009 Migraine without aura, not intractable, without status migrainosus: Secondary | ICD-10-CM

## 2019-11-12 MED ORDER — UBRELVY 50 MG PO TABS: 50.0000 mg | ORAL_TABLET | Freq: Every day | ORAL | 5 refills | Status: DC | PRN

## 2019-11-12 NOTE — Telephone Encounter (Signed)
Patient has FU in Nov; refilled Ubrelvy x 6 months.

## 2019-11-12 NOTE — Addendum Note (Signed)
Addended by: Colen Darling C on: 11/12/2019 11:47 AM   Modules accepted: Orders

## 2019-11-12 NOTE — Telephone Encounter (Signed)
Called mother, on DPR, LVM informing her the patient needs a follow up, last seen 08/2018. I advised once he has FU scheduled we will refill Ubrelvy. Left #.

## 2019-11-12 NOTE — Telephone Encounter (Signed)
Pt's mother, Stephen Bass called request refill Ubrogepant (UBRELVY) 50 MG TABS at [id number]. Ms. Labat stated, Insurance saying physician need to approve refill for Ubrogepant (UBRELVY) 50 MG TABS.

## 2019-11-12 NOTE — Telephone Encounter (Signed)
Pt's mother returned call and scheduled appt with MD. NP did not have a sooner appt or the availability that pt needed. Please call in pt's Ubrogepant (UBRELVY) 50 MG TABS refill.

## 2019-11-17 ENCOUNTER — Encounter: Payer: Self-pay | Admitting: *Deleted

## 2019-11-17 ENCOUNTER — Telehealth: Payer: Self-pay | Admitting: *Deleted

## 2019-11-17 NOTE — Telephone Encounter (Signed)
Pt's mother called Insurance need PA for Ubrogepant (UBRELVY) 50 MG TABS. Insurance asking if  He has treied other medications that didn't work so he can get the Ubrogepant (UBRELVY) 50 MG TABS. Would like a call from the number

## 2019-11-17 NOTE — Telephone Encounter (Signed)
Stephen Bass denied because Rx is for more than 8 pills a month. Stephen Bass is approved for 8 tablets a month through 11/16/2020. Sent my chart to advise patient of this.

## 2019-11-17 NOTE — Telephone Encounter (Signed)
Stephen Bass working on [employer] for [employer].

## 2019-11-17 NOTE — Telephone Encounter (Signed)
Bernita Raisin PA, key: [id number], [id number], failed Imitrex, Maxalt, Relpax,  Triptans. Your information has been sent to [facility].

## 2020-02-01 ENCOUNTER — Ambulatory Visit: Payer: 59 | Admitting: Neurology

## 2020-02-01 ENCOUNTER — Other Ambulatory Visit: Payer: Self-pay

## 2020-02-01 ENCOUNTER — Encounter: Payer: Self-pay | Admitting: Neurology

## 2020-02-01 DIAGNOSIS — G43009 Migraine without aura, not intractable, without status migrainosus: Secondary | ICD-10-CM

## 2020-02-01 MED ORDER — UBRELVY 50 MG PO TABS: ORAL_TABLET | ORAL | 11 refills | Status: DC

## 2020-02-01 NOTE — Patient Instructions (Signed)
Stephen Bass: 50-100mg  at onset of headache and can repeat 2 hours later. Max 200mg  a day.  Ubrogepant tablets What is this medicine? UBROGEPANT (ue BROE je pant) is used to treat migraine headaches with or without aura. An aura is a strange feeling or visual disturbance that warns you of an attack. It is not used to prevent migraines. This medicine may be used for other purposes; ask your health care provider or pharmacist if you have questions. COMMON BRAND NAME(S): What should I tell my health care provider before I take this medicine? They need to know if you have any of these conditions:  kidney disease  liver disease  an unusual or allergic reaction to ubrogepant, other medicines, foods, dyes, or preservatives  pregnant or trying to get pregnant  breast-feeding How should I use this medicine? Take this medicine by mouth with a glass of water. Follow the directions on the prescription label. You can take it with or without food. If it upsets your stomach, take it with food. Take your medicine at regular intervals. Do not take it more often than directed. Do not stop taking except on your doctor's advice. Talk to your pediatrician about the use of this medicine in children. Special care may be needed. Overdosage: If you think you have taken too much of this medicine contact a poison control center or emergency room at once. NOTE: This medicine is only for you. Do not share this medicine with others. What if I miss a dose? This does not apply. This medicine is not for regular use. What may interact with this medicine? Do not take this medicine with any of the following medicines:  ceritinib  certain antibiotics like chloramphenicol, clarithromycin, telithromycin  certain antivirals for HIV like atazanavir, cobicistat, darunavir, delavirdine, fosamprenavir, indinavir, ritonavir  certain medicines for fungal infections like itraconazole, ketoconazole, posaconazole,  voriconazole  conivaptan  grapefruit  idelalisib  mifepristone  nefazodone  ribociclib This medicine may also interact with the following medications:  carvedilol  certain medicines for seizures like phenobarbital, phenytoin  ciprofloxacin  cyclosporine  eltrombopag  fluconazole  fluvoxamine  quinidine  rifampin  St. John's wort  verapamil This list may not describe all possible interactions. Give your health care provider a list of all the medicines, herbs, non-prescription drugs, or dietary supplements you use. Also tell them if you smoke, drink alcohol, or use illegal drugs. Some items may interact with your medicine. What should I watch for while using this medicine? Visit your health care professional for regular checks on your progress. Tell your health care professional if your symptoms do not start to get better or if they get worse. Your mouth may get dry. Chewing sugarless gum or sucking hard candy and drinking plenty of water may help. Contact your health care professional if the problem does not go away or is severe. What side effects may I notice from receiving this medicine? Side effects that you should report to your doctor or health care professional as soon as possible:  allergic reactions like skin rash, itching or hives; swelling of the face, lips, or tongue Side effects that usually do not require medical attention (report these to your doctor or health care professional if they continue or are bothersome):  drowsiness  dry mouth  nausea  tiredness This list may not describe all possible side effects. Call your doctor for medical advice about side effects. You may report side effects to FDA at 1-800-FDA-1088. Where should I keep my medicine? Keep  out of the reach of children. Store at room temperature between 15 and 30 degrees C (59 and 86 degrees F). Throw away any unused medicine after the expiration date. NOTE: This sheet is a summary. It  may not cover all possible information. If you have questions about this medicine, talk to your doctor, pharmacist, or health care provider.  2020 Elsevier/Gold Standard (2018-05-29 08:50:55)

## 2020-02-01 NOTE — Progress Notes (Signed)
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 Has had occipital nerve blocks last seen  09/17/2018: Seen by NP Amy Lomax, chart reviewed. He reported fioricet caused dizziness. Migraines once a month. Already tried imitrex(side effects), relpax(ineffective), maxalt(ineffective), zofran, phenergan and flexeril. Topiramate and nortriptyline made migraines worse, I tried Ajovy on him and he declines it because he doesn't like needles, declines botox, . Headaches worsened after 2017 when he had a concussion. MRI of the brain was unremarkable. Amy lomax started Ubrelvy. Migraines 4x a month and Ubrelvy working great. No problems with getting Bernita Raisinubrelvy and he has enough with 10.a month. Kicks in 30 minutes.   Interval history 02/11/2018: Patient is here with his mother.  Mother provides much information.  Patient has migraines.  Patient felt the nortriptyline made his migraines worse.  He had a reaction to atrophy or possibly just anxiety, he does not like needles, he declines any further CGRP's or Botox or any other medications or procedures involving needles.  He also had a very bad reaction to the Imitrex and does not want to take triptans anymore.  I gave him a limited supply of Fioricet, discussed medication overuse headache.  He feels his headache frequency is not bad enough to try another preventative, discussed likely topiramate would be the next thing we could try or Zonegran since his mom had a bad reaction to topiramate for her migraines.  At this point he is okay as is with the Fioricet as needed.  HPI:  Stephen Bass is a 19 y.o. male here as a referral from Dr. Egbert GaribaldiSlatosky for migraines. PMHx migraines. Here with his mother who provides information. The headaches started after the concussions. He hit his head on the back left.  Twice while playing basketball. There was some confusion but no loss of consciousness and quickly resolved. Last was 03/2015 and he started having migraines, started in the back of the head, like a hammer, pounding, pulsating not necessarily burning or shooting or tingling, light and sound sensitivity, nausea, his muscles in the back of his neck hurts but later diagnosed as swelled lymph nodes. They have lasted up to 2 days. Average >4 hours. Sensitivity tot he skull. Unknown triggers, have kept a migraine diary and no patterns. 15 headache days a month. 8 migrainous. They are acute and severe. No Aura. He will get associated finger numbness. [facility]. Impairs his life. Maybe a trigger is neck position looking down a lot neck muscles hurt. Sleep helps.  maxalt doesn't help. He takes 2 ibuprofen, a benadryl, and a nausea pill. No medication overuse.   Reviewed notes, labs and imaging from outside physicians, which showed:  Reviewed referring physician notes.  19 year old presents complaining of headache with history of headaches.  Last headache lasted 3 days and was 1 month ago.  He has been on visit trip 10 prescribed by his neurologist but he says it is not helped his headaches.  He has no aura or photophobia no visual changes he has photophobia headache is throbbing pounding always occurs in the occipital area.  He has no premonition of headache.  Asked for a new neurologist.  Medications tried: Topiramate which increased headaches, Flexeril, ibuprofen, Excedrin, Benadryl, Phenergan, Maxalt  Reviewed neurologist notes.  Patient was seeing Dr. Clelia CroftShaw.  He was diagnosed with migraine with aura with  a component of occipital neuralgia.  They performed occipital nerve blocks.  It appears that they have recommended Maxalt in the past and magnesium.  The headache started in 2007 was pounding, squeezing, throbbing, constant pressure, exploding pain in the occipital region.  He gets light sensitivity and noise  sensitivity, headaches reach its worst after onset quickly.  Headaches may last a variable time.  Worsens with physical activity.  Activity triggers the headaches.  Sleeping in a dark room helps.  Patient is seen Dr. Sharene Skeans for symptoms.  Patient has been to the emergency room for this.  He does not suffer from depression, obstructive sleep apnea, obesity.  He does suffer from anxiety.  He has a history of head trauma and concussions.  Last concussion was in January 2017.  Since then his headaches have worsened and are more frequent.  He sleeps from 10-8 nightly.  Family history of headaches in the mother.  Patient tried meloxicam as well.  MRI brain 08/2015: showed No acute intracranial abnormalities including mass lesion or mass effect, hydrocephalus, extra-axial fluid collection, midline shift, hemorrhage, or acute infarction, large ischemic events (personally reviewed images)    Review of Systems: Patient complains of symptoms per HPI as well as the following symptoms: headache. Pertinent negatives and positives per HPI. All others negative   Social History   Socioeconomic History  . Marital status: Single    Spouse name: Not on file  . Number of children: Not on file  . Years of education: Archivist   . Highest education level: Some college, no degree  Occupational History  . Not on file  Tobacco Use  . Smoking status: Never Smoker  . Smokeless tobacco: Never Used  Vaping Use  . Vaping Use: Never used  Substance and Sexual Activity  . Alcohol use: No  . Drug use: No  . Sexual activity: Never  Other Topics Concern  . 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 He lives with his parents and sibling. He enjoys basketball and running.   Caffeine: 2 cups of tea daily   Right handed   Social Determinants of Health   Financial Resource Strain:   . Difficulty of Paying Living Expenses: Not on file  Food Insecurity:   .  Worried About Programme researcher, broadcasting/film/video in the Last Year: Not on file  . Ran Out of Food in the Last Year: Not on file  Transportation Needs:   . Lack of Transportation (Medical): Not on file  . Lack of Transportation (Non-Medical): Not on file  Physical Activity:   . Days of Exercise per Week: Not on file  . Minutes of Exercise per Session: Not on file  Stress:   . Feeling of Stress : Not on file  Social Connections:   . Frequency of Communication with Friends and Family: Not on file  . Frequency of Social Gatherings with Friends and Family: Not on file  . Attends Religious Services: Not on file  . Active Member of Clubs or Organizations: Not on file  . Attends Banker Meetings: Not on file  . Marital Status: Not on file  Intimate Partner Violence:   . Fear of Current or Ex-Partner: Not on file  . Emotionally Abused: Not on file  . Physically Abused: Not on file  . Sexually Abused: Not on file    Family History  Problem Relation Age of Onset  . COPD Paternal  Grandfather        Died at the age of 48  . Migraines Mother   . Endometriosis Mother   . Hyperlipidemia Father   . Migraines Paternal Grandmother     Past Medical History:  Diagnosis Date  . Abdominal pain   . Acid reflux   . Asthma   . Headache(784.0)   . IBS (irritable bowel syndrome)   . Migraine     Past Surgical History:  Procedure Laterality Date  . NO PAST SURGERIES    . WISDOM TOOTH EXTRACTION Bilateral 10/24/2017    Current Outpatient Medications  Medication Sig Dispense Refill  . albuterol (PROVENTIL HFA;VENTOLIN HFA) 108 (90 Base) MCG/ACT inhaler Inhale 2 puffs into the lungs. Use as needed for exercise induced asthma     . dicyclomine (BENTYL) 10 MG capsule Take 10 mg by mouth 3 (three) times daily as needed.    . fexofenadine (ALLEGRA) 60 MG tablet Take 60 mg by mouth daily.    . ondansetron (ZOFRAN-ODT) 4 MG disintegrating tablet Take 1 tablet (4 mg total) by mouth every 8 (eight)  hours as needed for nausea. 30 tablet 3  . Ubrogepant (UBRELVY) 50 MG TABS 50-100mg (1-2 tabs) at onset of headache and can repeat 2 hours later. Max 200mg  a day. 10 tablet 11   No current facility-administered medications for this visit.    Allergies as of 02/01/2020 - Review Complete 02/01/2020  Allergen Reaction Noted  . Amoxicillin-pot clavulanate Diarrhea 03/23/2013    Vitals: BP 103/67 (BP Location: Right Arm, Patient Position: Sitting)   Pulse 78   Ht 6\' 1"  (1.854 m)   Wt 155 lb (70.3 kg)   BMI 20.45 kg/m  Last Weight:  Wt Readings from Last 1 Encounters:  02/01/20 155 lb (70.3 kg)   Last Height:   Ht Readings from Last 1 Encounters:  02/01/20 6\' 1"  (1.854 m)   Exam: NAD, pleasant                  Speech:    Speech is normal; fluent and spontaneous with normal comprehension.  Cognition:    The patient is oriented to person, place, and time;     recent and remote memory intact;     language fluent;    Cranial Nerves:    The pupils are equal, round, and reactive to light.Trigeminal sensation is intact and the muscles of mastication are normal. The face is symmetric. The palate elevates in the midline. Hearing intact. Voice is normal. Shoulder shrug is normal. The tongue has normal motion without fasciculations.   Coordination:  No dysmetria  Motor Observation:    No asymmetry, no atrophy, and no involuntary movements noted. Tone:    Normal muscle tone.     Strength:    Strength is V/V in the upper and lower limbs.      Sensation: intact to LT  Assessment/Plan:  Patient with migraines. Doing GREAT on [place], continue. Can increase dose if needed. Discussed if 50mg  works and he catches it early that is fine, otherwise he can also try 100mg  and repeat in 2 hours. Discussed best to take asap, carry it around, other acute migraine management tips.   Medications tried:  Already tried imitrex(side effects), relpax(ineffective), maxalt(ineffective), zofran, phenergan  and flexeril. Topiramate and nortriptyline made migraines worse, I tried Ajovy on him and he declines it because he doesn't like needles, declines botox, . Headaches worsened after 2017 when he had a concussion.  Meds ordered this encounter  Medications  . Ubrogepant (UBRELVY) 50 MG TABS    Sig: 50-100mg (1-2 tabs) at onset of headache and can repeat 2 hours later. Max 200mg  a day.    Dispense:  10 tablet    Refill:  11    10 or max allowed.   , MD  [facility] [address] [place], [place]  Phone [phone] Fax [phone]  I spent 20 minutes of face-to-face and non-face-to-face time with patient on the  1. Migraine without aura and without status migrainosus, not intractable    diagnosis.  This included previsit chart review, lab review, study review, order entry, electronic health record documentation, patient education on the different diagnostic and therapeutic options, counseling and coordination of care, risks and benefits of management, compliance, or risk factor reduction

## 2020-03-23 ENCOUNTER — Other Ambulatory Visit: Payer: Self-pay | Admitting: Family Medicine

## 2020-03-23 ENCOUNTER — Telehealth: Payer: Self-pay | Admitting: Neurology

## 2020-03-23 MED ORDER — METHYLPREDNISOLONE 4 MG PO TBPK: ORAL_TABLET | ORAL | 0 refills | Status: DC

## 2020-03-23 NOTE — Telephone Encounter (Signed)
Pt's mother called stating that the pt has been complaining about a sharp pain in the back of his neck for the past 6 days. She would like RN to call back to discuss.

## 2020-03-23 NOTE — Telephone Encounter (Signed)
Baird Lyons, I am out of the office this week. Please let patient know I will have to get back to him next week or the work-in doctor needs to address thanks

## 2020-03-23 NOTE — Telephone Encounter (Signed)
Called the mom back and there was no answer. Then called the home number that was listed. I was able to get them at that number. Advised that Amy thinks this may be occipital neuralgia. She would recommend a steroid dose pack. Advised we would send that in to see if that helps. Reviewed symptoms to watch for. She verbalized understanding.

## 2020-03-23 NOTE — Telephone Encounter (Signed)
We could try a medrol taper pack with him if he is able to tolerate steroids. 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 I would recommend continuing Ubrelvy if helpful for discomfort. If he has any worsening or pain, new symptoms of numbness, tingling or vision changes he should let us know.

## 2020-03-23 NOTE — Telephone Encounter (Signed)
Called the mother back and the patient was with her at this time. The neck pain has been something that he has had on and off since his concussion. Dr Lucia Gaskins is aware and it sometimes is the culprit to his migraines. What he is describing that has been going on in the last week is different because he states it is a intermittent sharp stabbing like pain that is near shoulders and base of skull. States that the pain can radiate above the ear/head.  He has attempted taking the ubrelvy to see if it would ease off and he states it has not touched the sharp/stabbing like pain.  He describes that sensation as constantly intermittent throughout the day and this last week. He has not had any recent imaging of the neck completed. Advised the patient I would send this information to Dr Lucia Gaskins and see what her thoughts and recommendations are. Advised we would contact him back. Pt verbalized understanding.

## 2020-03-28 ENCOUNTER — Telehealth: Payer: Self-pay | Admitting: Neurology

## 2020-03-28 NOTE — Telephone Encounter (Signed)
Pt.s mom [name] is on DPR. She states methylPREDNISolone (MEDROL DOSEPAK) 4 MG TBPK tablet is not working & what does Dr. recommend. Please advise.

## 2020-03-28 NOTE — Telephone Encounter (Signed)
He has to be seen. Please give him an appoitment with me or Amy. And it would help if patient called himself so office could speak with him directly. thanks

## 2020-03-28 NOTE — Telephone Encounter (Signed)
I called the pt's mother back and LVM (ok per DPR) asking for call back to schedule the patient with Amy NP or Dr Lucia Gaskins. As of now, weather permitting, Amy has availability tomorrow and Wednesday. I also requested politely per Dr Lucia Gaskins that it would be good if the patient contacts [place] when needed so we can speak with him directly. Advised office closing a little early today due to inclement weather.

## 2020-03-29 ENCOUNTER — Ambulatory Visit: Payer: 59 | Admitting: Family Medicine

## 2020-03-29 ENCOUNTER — Encounter: Payer: Self-pay | Admitting: Family Medicine

## 2020-03-29 VITALS — BP 107/64 | HR 87 | Ht 73.0 in | Wt 154.0 lb

## 2020-03-29 DIAGNOSIS — M5481 Occipital neuralgia: Secondary | ICD-10-CM

## 2020-03-29 DIAGNOSIS — G43009 Migraine without aura, not intractable, without status migrainosus: Secondary | ICD-10-CM

## 2020-03-29 DIAGNOSIS — M542 Cervicalgia: Secondary | ICD-10-CM

## 2020-03-29 DIAGNOSIS — G8929 Other chronic pain: Secondary | ICD-10-CM

## 2020-03-29 MED ORDER — GABAPENTIN 100 MG PO CAPS: 100.0000 mg | ORAL_CAPSULE | Freq: Three times a day (TID) | ORAL | 11 refills | Status: DC

## 2020-03-29 MED ORDER — TIZANIDINE HCL 2 MG PO CAPS: 2.0000 mg | ORAL_CAPSULE | Freq: Three times a day (TID) | ORAL | 2 refills | Status: DC | PRN

## 2020-03-29 NOTE — Progress Notes (Addendum)
Chief Complaint  Patient presents with  . Follow-up    Rm 1, with mother, c/o headache x 12 days,      HISTORY OF PRESENT ILLNESS: Today 03/29/20  Stephen Bass is a 20 y.o. male here today for follow up for headaches. He called to report worsening neck and occipital pain on 12/29. Medrol dosepak given that he feels may have helped a little but did not resolve pain. Roselyn Meier does not help at all. He describes pain starting in the base of the left side of his head that radiates up to the left temple. Now pain is starting to effect left ear. Pain waxes and wanes. He has not been able to correlate any particular movements or activities that help or worsen pain. He reports that he has been lifting weights recently and feels that 2-3 days after starting is when pain started. His mother gave him one dose of gabapentin 300mg . He reports that he felt "really weak all over" and was not sure if it helped. He feels dose was too strong. He has chronic neck pain from previous injury playing football. He does not think he has had imaging of his neck. He describes a muscle tension of the right trapezius that waxes and wanes. No radicular symptoms.   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 Has had occipital nerve blocks last seen  09/17/2018: Seen by NP Lydiana Milley, chart reviewed. He reported fioricet caused dizziness. Migraines once a month. Already tried imitrex(side effects), relpax(ineffective), maxalt(ineffective), zofran, phenergan and flexeril. Topiramate and nortriptyline made migraines worse, I tried Ajovy on him and he declines it because he doesn't like needles, declines botox, . Headaches worsened after 2017 when he had a concussion. MRI of the brain was unremarkable. Stephen Bass started Ubrelvy. Migraines 4x a month and Ubrelvy working great. No problems with getting Roselyn Meier and he has enough with 10.a month. Kicks in 30 minutes.   Interval  history 02/11/2018: Patient is here with his mother.  Mother provides much information.  Patient has migraines.  Patient felt the nortriptyline made his migraines worse.  He had a reaction to atrophy or possibly just anxiety, he does not like needles, he declines any further CGRP's or Botox or any other medications or procedures involving needles.  He also had a very bad reaction to the Imitrex and does not want to take triptans anymore.  I gave him a limited supply of Fioricet, discussed medication overuse headache.  He feels his headache frequency is not bad enough to try another preventative, discussed likely topiramate would be the next thing we could try or Zonegran since his mom had a bad reaction to topiramate for her migraines.  At this point he is okay as is with the Fioricet as needed.  HPI:  Stephen Bass is a 20 y.o. male here as a referral from Dr. Wendie Agreste for migraines. PMHx migraines. Here with his mother who provides information. The headaches started after the concussions. He hit his head on the back left. Twice while playing basketball. There was some confusion but no loss of consciousness and quickly resolved. Last was 03/2015 and he started having migraines, started in the back of the head, like a hammer, pounding, pulsating not necessarily burning or shooting or tingling, light and sound sensitivity, nausea, his muscles in the back of his neck hurts but later diagnosed as swelled lymph nodes. They have lasted up to 2 days. Average >4 hours.  Sensitivity tot he skull. Unknown triggers, have kept a migraine diary and no patterns. 15 headache days a month. 8 migrainous. They are acute and severe. No Aura. He will get associated finger numbness. He goes to [facility]. Impairs his life. Maybe a trigger is neck position looking down a lot neck muscles hurt. Sleep helps.  maxalt doesn't help. He takes 2 ibuprofen, a benadryl, and a nausea pill. No medication overuse.   Reviewed notes, labs and imaging  from outside physicians, which showed:  Reviewed referring physician notes.  20 year old presents complaining of headache with history of headaches.  Last headache lasted 3 days and was 1 month ago.  He has been on visit trip 10 prescribed by his neurologist but he says it is not helped his headaches.  He has no aura or photophobia no visual changes he has photophobia headache is throbbing pounding always occurs in the occipital area.  He has no premonition of headache.  Asked for a new neurologist.  Medications tried: Topiramate which increased headaches, Flexeril, ibuprofen, Excedrin, Benadryl, Phenergan, Maxalt  Reviewed neurologist notes.  Patient was seeing Dr. Clelia Croft.  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  They performed occipital nerve blocks.  It appears that they have recommended Maxalt in the past and magnesium.  The headache started in 2007 was pounding, squeezing, throbbing, constant pressure, exploding pain in the occipital region.  He gets light sensitivity and noise sensitivity, headaches reach its worst after onset quickly.  Headaches may last a variable time.  Worsens with physical activity.  Activity triggers the headaches.  Sleeping in a dark room helps.  Patient is seen Dr. Sharene Skeans for symptoms.  Patient has been to the emergency room for this.  He does not suffer from depression, obstructive sleep apnea, obesity.  He does suffer from anxiety.  He has a history of head trauma and concussions.  Last concussion was in January 2017.  Since then his headaches have worsened and are more frequent.  He sleeps from 10-8 nightly.  Family history of headaches in the mother.  Patient tried meloxicam as well.  MRI brain 08/2015: showed No acute intracranial abnormalities including mass lesion or mass effect, hydrocephalus, extra-axial fluid collection, midline shift, hemorrhage, or acute infarction, large ischemic events (personally reviewed  images)   REVIEW OF SYSTEMS: Out of a complete 14 system review of symptoms, the patient complains only of the following symptoms, neck pain, headaches, occipital pain and all other reviewed systems are negative.   ALLERGIES: Allergies  Allergen Reactions  . Amoxicillin-Pot Clavulanate Diarrhea     HOME MEDICATIONS: Outpatient Medications Prior to Visit  Medication Sig Dispense Refill  . albuterol (PROVENTIL HFA;VENTOLIN HFA) 108 (90 Base) MCG/ACT inhaler Inhale 2 puffs into the lungs. Use as needed for exercise induced asthma    . dicyclomine (BENTYL) 10 MG capsule Take 10 mg by mouth 3 (three) times daily as needed.    . fexofenadine (ALLEGRA) 60 MG tablet Take 60 mg by mouth daily.    . methylPREDNISolone (MEDROL DOSEPAK) 4 MG TBPK tablet Follow instructions on tapering. 21 tablet 0  . ondansetron (ZOFRAN-ODT) 4 MG disintegrating tablet Take 1 tablet (4 mg total) by mouth every 8 (eight) hours as needed for nausea. 30 tablet 3  . Ubrogepant (UBRELVY) 50 MG TABS 50-100mg (1-2 tabs) at onset of headache and can repeat 2 hours later. Max 200mg  a day. 10 tablet 11   No facility-administered medications prior to visit.  PAST MEDICAL HISTORY: Past Medical History:  Diagnosis Date  . Abdominal pain   . Acid reflux   . Asthma   . Headache(784.0)   . IBS (irritable bowel syndrome)   . Migraine      PAST SURGICAL HISTORY: Past Surgical History:  Procedure Laterality Date  . NO PAST SURGERIES    . WISDOM TOOTH EXTRACTION Bilateral 10/24/2017     FAMILY HISTORY: Family History  Problem Relation Age of Onset  . COPD Paternal Grandfather        Died at the age of 43  . Migraines Mother   . Endometriosis Mother   . Hyperlipidemia Father   . Migraines Paternal Grandmother      SOCIAL HISTORY: Social History   Socioeconomic History  . Marital status: Single    Spouse name: Not on file  . Number of children: Not on file  . Years of education: Archivist   .  Highest education level: Some college, no degree  Occupational History  . Not on file  Tobacco Use  . Smoking status: Never Smoker  . Smokeless tobacco: Never Used  Vaping Use  . Vaping Use: Never used  Substance and Sexual Activity  . Alcohol use: No  . Drug use: No  . Sexual activity: Never  Other Topics Concern  . 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 He lives with his parents and sibling. He enjoys basketball and running.   Caffeine: 2 cups of tea daily   Right handed   Social Determinants of Health   Financial Resource Strain: Not on file  Food Insecurity: Not on file  Transportation Needs: Not on file  Physical Activity: Not on file  Stress: Not on file  Social Connections: Not on file  Intimate Partner Violence: Not on file      PHYSICAL EXAM  Vitals:   03/29/20 1011  BP: 107/64  Pulse: 87  Weight: 154 lb (69.9 kg)  Height: 6\' 1"  (1.854 m)   Body mass index is 20.32 kg/m.   Generalized: Well developed, in no acute distress  Cardiology: normal rate and rhythm, no murmur auscultated  Respiratory: clear to auscultation bilaterally    Neurological examination  Mentation: Alert oriented to time, place, history taking. Follows all commands speech and language fluent Cranial nerve II-XII: Pupils were equal round reactive to light. Extraocular movements were full, visual field were full on confrontational test. Facial sensation and strength were normal. Head turning and shoulder shrug  were normal and symmetric. Motor: The motor testing reveals 5 over 5 strength of all 4 extremities. Good symmetric motor tone is noted throughout.  Sensory: Sensory testing is intact to soft touch on all 4 extremities. No evidence of extinction is noted.  Coordination: Cerebellar testing reveals good finger-nose-finger and heel-to-shin bilaterally.  Gait and station: Gait is normal. Reflexes: Deep tendon reflexes are symmetric  and normal bilaterally.     DIAGNOSTIC DATA (LABS, IMAGING, TESTING) - I reviewed patient records, labs, notes, testing and imaging myself where available.  Lab Results  Component Value Date   WBC 5.2 07/05/2017   HGB 15.0 07/05/2017   HCT 43.0 07/05/2017   MCV 87 07/05/2017   PLT 263 07/05/2017      Component Value Date/Time   NA 141 07/05/2017 1101   K 4.7 07/05/2017 1101   CL 103 07/05/2017 1101   CO2 24 07/05/2017 1101   GLUCOSE 92 07/05/2017 1101  GLUCOSE 91 07/29/2012 2018   BUN 14 07/05/2017 1101   CREATININE 0.97 07/05/2017 1101   CALCIUM 9.8 07/05/2017 1101   PROT 7.5 07/05/2017 1101   ALBUMIN 4.7 07/05/2017 1101   AST 19 07/05/2017 1101   ALT 19 07/05/2017 1101   ALKPHOS 88 07/05/2017 1101   BILITOT 0.6 07/05/2017 1101   GFRNONAA 113 07/05/2017 1101   GFRAA 131 07/05/2017 1101   No results found for: CHOL, HDL, LDLCALC, LDLDIRECT, TRIG, CHOLHDL No results found for: QBVQ9I No results found for: VITAMINB12 Lab Results  Component Value Date   TSH 1.250 07/05/2017    No flowsheet data found.   ASSESSMENT AND PLAN  20 y.o. year old male  has a past medical history of Abdominal pain, Acid reflux, Asthma, Headache(784.0), IBS (irritable bowel syndrome), and Migraine. here with   Migraine without aura and without status migrainosus, not intractable  Bilateral occipital neuralgia  Chronic neck pain  Basim has had worsening occipital pain for the past 12 days. Minimum improvement with steroids. He has chronic neck pain but no radicular symptoms. We will start gabapentin 100mg  and titrate to TID dosing. I will also give him tizanidine 2mg  to use as needed for neck pain. May consider imaging in the future if no improvement. He will avoid weight lifting until pain resolves then return slowly. Healthy lifestyle habits encouraged. He will follow up in 6 months, sooner if needed. He and his mother verbalize understanding and agreement with this plan.   No orders  of the defined types were placed in this encounter.     I spent 20 minutes of face-to-face and non-face-to-face time with patient.  This included previsit chart review, lab review, study review, order entry, electronic health record documentation, patient education.    , MSN, FNP-C 03/29/2020, 10:41 AM  [facility] [address] [place], [address] [place] [phone]  Made any corrections needed, and agree with history, physical, neuro exam,assessment and plan as stated.     [id number], MD [facility]

## 2020-03-29 NOTE — Patient Instructions (Signed)
Below is our plan:  We will start gabapentin 100mg  up to three times daily. Start with one capsule daily and increase as tolerated. You can also try tizanidine for neck pain that could contribute to occipital pain. Take 1 tablet up to three times daily as tolerated. These medications can make you dizzy/groggy so please do not take them together and do not take them if you are going to be driving or working with heavy machinery. Use warm/cold compresses as directed if helpful. Avoid weight lifting until pain is managed.   Please make sure you are staying well hydrated. I recommend 50-60 ounces daily. Well balanced diet and regular exercise encouraged.   Please continue follow up with care team as directed.   Follow up in 6 months   You may receive a survey regarding today's visit. I encourage you to leave honest feed back as I do use this information to improve patient care. Thank you for seeing me today!     Gabapentin capsules or tablets What is this medicine? GABAPENTIN (GA ba pen tin) is used to control seizures in certain types of epilepsy. It is also used to treat certain types of nerve pain. This medicine may be used for other purposes; ask your health care provider or pharmacist if you have questions. COMMON BRAND NAME(S): Active-PAC with Gabapentin, Gabarone, Neurontin What should I tell my health care provider before I take this medicine? They need to know if you have any of these conditions:  history of drug abuse or alcohol abuse problem  kidney disease  lung or breathing disease  suicidal thoughts, plans, or attempt; a previous suicide attempt by you or a family member  an unusual or allergic reaction to gabapentin, other medicines, foods, dyes, or preservatives  pregnant or trying to get pregnant  breast-feeding How should I use this medicine? Take this medicine by mouth with a glass of water. Follow the directions on the prescription label. You can take it with or  without food. If it upsets your stomach, take it with food. Take your medicine at regular intervals. Do not take it more often than directed. Do not stop taking except on your doctor's advice. If you are directed to break the 600 or 800 mg tablets in half as part of your dose, the extra half tablet should be used for the next dose. If you have not used the extra half tablet within 28 days, it should be thrown away. A special MedGuide will be given to you by the pharmacist with each prescription and refill. Be sure to read this information carefully each time. Talk to your pediatrician regarding the use of this medicine in children. While this drug may be prescribed for children as young as 3 years for selected conditions, precautions do apply. Overdosage: If you think you have taken too much of this medicine contact a poison control center or emergency room at once. NOTE: This medicine is only for you. Do not share this medicine with others. What if I miss a dose? If you miss a dose, take it as soon as you can. If it is almost time for your next dose, take only that dose. Do not take double or extra doses. What may interact with this medicine? This medicine may interact with the following medications:  alcohol  antihistamines for allergy, cough, and cold  certain medicines for anxiety or sleep  certain medicines for depression like amitriptyline, fluoxetine, sertraline  certain medicines for seizures like phenobarbital, primidone  certain medicines for stomach problems  general anesthetics like halothane, isoflurane, methoxyflurane, propofol  local anesthetics like lidocaine, pramoxine, tetracaine  medicines that relax muscles for surgery  narcotic medicines for pain  phenothiazines like chlorpromazine, mesoridazine, prochlorperazine, thioridazine This list may not describe all possible interactions. Give your health care provider a list of all the medicines, herbs, non-prescription  drugs, or dietary supplements you use. Also tell them if you smoke, drink alcohol, or use illegal drugs. Some items may interact with your medicine. What should I watch for while using this medicine? Visit your doctor or health care provider for regular checks on your progress. You may want to keep a record at home of how you feel your condition is responding to treatment. You may want to share this information with your doctor or health care provider at each visit. You should contact your doctor or health care provider if your seizures get worse or if you have any new types of seizures. Do not stop taking this medicine or any of your seizure medicines unless instructed by your doctor or health care provider. Stopping your medicine suddenly can increase your seizures or their severity. This medicine may cause serious skin reactions. They can happen weeks to months after starting the medicine. Contact your health care provider right away if you notice fevers or flu-like symptoms with a rash. The rash may be red or purple and then turn into blisters or peeling of the skin. Or, you might notice a red rash with swelling of the face, lips or lymph nodes in your neck or under your arms. Wear a medical identification bracelet or chain if you are taking this medicine for seizures, and carry a card that lists all your medications. You may get drowsy, dizzy, or have blurred vision. Do not drive, use machinery, or do anything that needs mental alertness until you know how this medicine affects you. To reduce dizzy or fainting spells, do not sit or stand up quickly, especially if you are an older patient. Alcohol can increase drowsiness and dizziness. Avoid alcoholic drinks. Your mouth may get dry. Chewing sugarless gum or sucking hard candy, and drinking plenty of water will help. The use of this medicine may increase the chance of suicidal thoughts or actions. Pay special attention to how you are responding while on  this medicine. Any worsening of mood, or thoughts of suicide or dying should be reported to your health care provider right away. Women who become pregnant while using this medicine may enroll in the [place] Antiepileptic Drug Pregnancy Registry by calling [phone]. This registry collects information about the safety of antiepileptic drug use during pregnancy. What side effects may I notice from receiving this medicine? Side effects that you should report to your doctor or health care professional as soon as possible:  allergic reactions like skin rash, itching or hives, swelling of the face, lips, or tongue  breathing problems  rash, fever, and swollen lymph nodes  redness, blistering, peeling or loosening of the skin, including inside the mouth  suicidal thoughts, mood changes Side effects that usually do not require medical attention (report to your doctor or health care professional if they continue or are bothersome):  dizziness  drowsiness  headache  nausea, vomiting  swelling of ankles, feet, hands  tiredness This list may not describe all possible side effects. Call your doctor for medical advice about side effects. You may report side effects to FDA at 1-800-FDA-1088. Where should I keep my medicine? Keep  out of reach of children. This medicine may cause accidental overdose and death if it taken by other adults, children, or pets. Mix any unused medicine with a substance like cat litter or coffee grounds. Then throw the medicine away in a sealed container like a sealed bag or a coffee can with a lid. Do not use the medicine after the expiration date. Store at room temperature between 15 and 30 degrees C (59 and 86 degrees F). NOTE: This sheet is a summary. It may not cover all possible information. If you have questions about this medicine, talk to your doctor, pharmacist, or health care provider.  2020 Elsevier/Gold Standard (2018-06-13  14:16:43)   Tizanidine tablets or capsules What is this medicine? TIZANIDINE (tye ZAN i deen) helps to relieve muscle spasms. It may be used to help in the treatment of multiple sclerosis and spinal cord injury. This medicine may be used for other purposes; ask your health care provider or pharmacist if you have questions. COMMON BRAND NAME(S): Zanaflex What should I tell my health care provider before I take this medicine? They need to know if you have any of these conditions:  kidney disease  liver disease  low blood pressure  mental disorder  an unusual or allergic reaction to tizanidine, other medicines, lactose (tablets only), foods, dyes, or preservatives  pregnant or trying to get pregnant  breast-feeding How should I use this medicine? Take this medicine by mouth with a full glass of water. Take this medicine on an empty stomach, at least 30 minutes before or 2 hours after food. Do not take with food unless you talk with your doctor. Follow the directions on the prescription label. Take your medicine at regular intervals. Do not take your medicine more often than directed. Do not stop taking except on your doctor's advice. Suddenly stopping the medicine can be very dangerous. Talk to your pediatrician regarding the use of this medicine in children. Patients over 22 years old may have a stronger reaction and need a smaller dose. Overdosage: If you think you have taken too much of this medicine contact a poison control center or emergency room at once. NOTE: This medicine is only for you. Do not share this medicine with others. What if I miss a dose? If you miss a dose, take it as soon as you can. If it is almost time for your next dose, take only that dose. Do not take double or extra doses. What may interact with this medicine? Do not take this medicine with any of the following medications:  ciprofloxacin  fluvoxamine  narcotic medicines for  cough  thiabendazole This medicine may also interact with the following medications:  acyclovir  alcohol  antihistamines for allergy, cough, and cold  baclofen  certain medicines for anxiety or sleep  certain medicines for blood pressure, heart disease, irregular heartbeat  certain medicines for depression like amitriptyline, fluoxetine, sertraline  certain medicines for seizures like phenobarbital, primidone  certain medicines for stomach problems like cimetidine, famotidine  male hormones, like estrogens or progestins and birth control pills, patches, rings, or injections  general anesthetics like halothane, isoflurane, methoxyflurane, propofol  local anesthetics like lidocaine, pramoxine, tetracaine  medicines that relax muscles for surgery  narcotic medicines for pain  phenothiazines like chlorpromazine, mesoridazine, prochlorperazine  ticlopidine  zileuton This list may not describe all possible interactions. Give your health care provider a list of all the medicines, herbs, non-prescription drugs, or dietary supplements you use. Also tell them if you  smoke, drink alcohol, or use illegal drugs. Some items may interact with your medicine. What should I watch for while using this medicine? Tell your doctor or health care professional if your symptoms do not start to get better or if they get worse. You may get drowsy or dizzy. Do not drive, use machinery, or do anything that needs mental alertness until you know how this medicine affects you. Do not stand or sit up quickly, especially if you are an older patient. This reduces the risk of dizzy or fainting spells. Alcohol may interfere with the effect of this medicine. Avoid alcoholic drinks. If you are taking another medicine that also causes drowsiness, you may have more side effects. Give your health care provider a list of all medicines you use. Your doctor will tell you how much medicine to take. Do not take more  medicine than directed. Call emergency for help if you have problems breathing or unusual sleepiness. Your mouth may get dry. Chewing sugarless gum or sucking hard candy, and drinking plenty of water may help. Contact your doctor if the problem does not go away or is severe. What side effects may I notice from receiving this medicine? Side effects that you should report to your doctor or health care professional as soon as possible:  allergic reactions like skin rash, itching or hives, swelling of the face, lips, or tongue  breathing problems  hallucinations  signs and symptoms of liver injury like dark yellow or brown urine; general ill feeling or flu-like symptoms; light-colored stools; loss of appetite; nausea; right upper quadrant belly pain; unusually weak or tired; yellowing of the eyes or skin  signs and symptoms of low blood pressure like dizziness; feeling faint or lightheaded, falls; unusually weak or tired  unusually slow heartbeat  unusually weak or tired Side effects that usually do not require medical attention (report to your doctor or health care professional if they continue or are bothersome):  blurred vision  constipation  dizziness  dry mouth  tiredness This list may not describe all possible side effects. Call your doctor for medical advice about side effects. You may report side effects to FDA at 1-800-FDA-1088. Where should I keep my medicine? Keep out of the reach of children. Store at room temperature between 15 and 30 degrees C (59 and 86 degrees F). Throw away any unused medicine after the expiration date. NOTE: This sheet is a summary. It may not cover all possible information. If you have questions about this medicine, talk to your doctor, pharmacist, or health care provider.  2020 Elsevier/Gold Standard (2016-12-25 13:33:29)    Occipital Neuralgia  Occipital neuralgia is a type of headache that causes brief episodes of very bad pain in the back  of your head. Pain from occipital neuralgia may spread (radiate) to other parts of your head. These headaches may be caused by irritation of the nerves that leave your spinal cord high up in your neck, just below the base of your skull (occipital nerves). Your occipital nerves transmit sensations from the back of your head, the top of your head, and the areas behind your ears. What are the causes? This condition can occur without any known cause (primary headache syndrome). In other cases, this condition is caused by pressure on or irritation of one of the two occipital nerves. Pressure and irritation may be due to:  Muscle spasm in the neck.  Neck injury.  Wear and tear of the vertebrae in the neck (osteoarthritis).  Disease  of the disks that separate the vertebrae.  Swollen blood vessels that put pressure on the occipital nerves.  Infections.  Tumors.  Diabetes. What are the signs or symptoms? This condition causes brief burning, stabbing, electric, shocking, or shooting pain which can radiate to the top of the head. It can happen on one side or both sides of the head. It can also cause:  Pain behind the eye.  Pain triggered by neck movement or hair brushing.  Scalp tenderness.  Aching in the back of the head between episodes of very bad pain.  Pain gets worse with exposure to bright lights. How is this diagnosed? There is no test that diagnoses this condition. Your health care provider may diagnose this condition based on a physical exam and your symptoms. Other tests may be done, such as:  Imaging studies of the brain and neck (cervical spine), such as an MRI or CT scan. These look for causes of pinched nerves.  Applying pressure to the nerves in the neck to try to re-create the pain.  Injection of numbing medicine into the occipital nerve areas to see if pain goes away (diagnostic nerve block). How is this treated? Treatment for this condition may begin with simple  measures, such as:  Rest.  Massage.  Applying heat or cold on the area.  Over-the-counter pain relievers. If these measures do not work, you may need other treatments, including:  Medicines, such as: ? Prescription-strength anti-inflammatory medicines. ? Muscle relaxants. ? Anti-seizure medicines, which can relieve pain. ? Antidepressants, which can relieve pain. ? Injected medicines, such as medicines that numb the area (local anesthetic) and steroids.  Pulsed radiofrequency ablation. This is when wires are implanted to deliver electrical impulses that block pain signals from the occipital nerve.  Surgery to relieve nerve pressure.  Physical therapy. Follow these instructions at home: Pain management      Avoid any activities that cause pain.  Rest when you have an attack of pain.  Try gentle massage to relieve pain.  Try a different pillow or sleeping position.  If directed, apply heat to the affected area as told by your health care provider. Use the heat source that your health care provider recommends, such as a moist heat pack or a heating pad. ? Place a towel between your skin and the heat source. ? Leave the heat on for 20-30 minutes. ? Remove the heat if your skin turns bright red. This is especially important if you are unable to feel pain, heat, or cold. You may have a greater risk of getting burned.  If directed, apply ice to the back of the head and neck area as told by your health care provider. ? Put ice in a plastic bag. ? Place a towel between your skin and the bag. ? Leave the ice on for 20 minutes, 2-3 times per day. General instructions  Take over-the-counter and prescription medicines only as told by your health care provider.  Avoid things that make your symptoms worse, such as bright lights.  Try to stay active. Get regular exercise that does not cause pain. Ask your health care provider to suggest safe exercises for you.  Work with a  physical therapist to learn stretching exercises you can do at home.  Practice good posture.  Keep all follow-up visits as told by your health care provider. This is important. Contact a health care provider if:  Your medicine is not working.  You have new or worsening symptoms. Get help  right away if:  You have very bad head pain that does not go away.  You have a sudden change in vision, balance, or speech. Summary  Occipital neuralgia is a type of headache that causes brief episodes of very bad pain in the back of your head.  Pain from occipital neuralgia may spread (radiate) to other parts of your head.  Treatment for this condition includes rest, massage, and medicines. This information is not intended to replace advice given to you by your health care provider. Make sure you discuss any questions you have with your health care provider. Document Revised: 02/26/2017 Document Reviewed: 05/17/2016 Elsevier Patient Education  2020 Elsevier Inc.   Migraine Headache A migraine headache is a very strong throbbing pain on one side or both sides of your head. This type of headache can also cause other symptoms. It can last from 4 hours to 3 days. Talk with your doctor about what things may bring on (trigger) this condition. What are the causes? The exact cause of this condition is not known. This condition may be triggered or caused by:  Drinking alcohol.  Smoking.  Taking medicines, such as: ? Medicine used to treat chest pain (nitroglycerin). ? Birth control pills. ? Estrogen. ? Some blood pressure medicines.  Eating or drinking certain products.  Doing physical activity. Other things that may trigger a migraine headache include:  Having a menstrual period.  Pregnancy.  Hunger.  Stress.  Not getting enough sleep or getting too much sleep.  Weather changes.  Tiredness (fatigue). What increases the risk?  Being 2-41 years old.  Being male.  Having a  family history of migraine headaches.  Being Caucasian.  Having depression or anxiety.  Being very overweight. What are the signs or symptoms?  A throbbing pain. This pain may: ? Happen in any area of the head, such as on one side or both sides. ? Make it hard to do daily activities. ? Get worse with physical activity. ? Get worse around bright lights or loud noises.  Other symptoms may include: ? Feeling sick to your stomach (nauseous). ? Vomiting. ? Dizziness. ? Being sensitive to bright lights, loud noises, or smells.  Before you get a migraine headache, you may get warning signs (an aura). An aura may include: ? Seeing flashing lights or having blind spots. ? Seeing bright spots, halos, or zigzag lines. ? Having tunnel vision or blurred vision. ? Having numbness or a tingling feeling. ? Having trouble talking. ? Having weak muscles.  Some people have symptoms after a migraine headache (postdromal phase), such as: ? Tiredness. ? Trouble thinking (concentrating). How is this treated?  Taking medicines that: ? Relieve pain. ? Relieve the feeling of being sick to your stomach. ? Prevent migraine headaches.  Treatment may also include: ? Having acupuncture. ? Avoiding foods that bring on migraine headaches. ? Learning ways to control your body functions (biofeedback). ? Therapy to help you know and deal with negative thoughts (cognitive behavioral therapy). Follow these instructions at home: Medicines  Take over-the-counter and prescription medicines only as told by your doctor.  Ask your doctor if the medicine prescribed to you: ? Requires you to avoid driving or using heavy machinery. ? Can cause trouble pooping (constipation). You may need to take these steps to prevent or treat trouble pooping:  Drink enough fluid to keep your pee (urine) pale yellow.  Take over-the-counter or prescription medicines.  Eat foods that are high in fiber. These include beans,  whole grains, and fresh fruits and vegetables.  Limit foods that are high in fat and sugar. These include fried or sweet foods. Lifestyle  Do not drink alcohol.  Do not use any products that contain nicotine or tobacco, such as cigarettes, e-cigarettes, and chewing tobacco. If you need help quitting, ask your doctor.  Get at least 8 hours of sleep every night.  Limit and deal with stress. General instructions      Keep a journal to find out what may bring on your migraine headaches. For example, write down: ? What you eat and drink. ? How much sleep you get. ? Any change in what you eat or drink. ? Any change in your medicines.  If you have a migraine headache: ? Avoid things that make your symptoms worse, such as bright lights. ? It may help to lie down in a dark, quiet room. ? Do not drive or use heavy machinery. ? Ask your doctor what activities are safe for you.  Keep all follow-up visits as told by your doctor. This is important. Contact a doctor if:  You get a migraine headache that is different or worse than others you have had.  You have more than 15 headache days in one month. Get help right away if:  Your migraine headache gets very bad.  Your migraine headache lasts longer than 72 hours.  You have a fever.  You have a stiff neck.  You have trouble seeing.  Your muscles feel weak or like you cannot control them.  You start to lose your balance a lot.  You start to have trouble walking.  You pass out (faint).  You have a seizure. Summary  A migraine headache is a very strong throbbing pain on one side or both sides of your head. These headaches can also cause other symptoms.  This condition may be treated with medicines and changes to your lifestyle.  Keep a journal to find out what may bring on your migraine headaches.  Contact a doctor if you get a migraine headache that is different or worse than others you have had.  Contact your doctor  if you have more than 15 headache days in a month. This information is not intended to replace advice given to you by your health care provider. Make sure you discuss any questions you have with your health care provider. Document Revised: 07/04/2018 Document Reviewed: 04/24/2018 Elsevier Patient Education  2020 [employer].

## 2020-09-20 ENCOUNTER — Encounter: Payer: Self-pay | Admitting: Family Medicine

## 2020-09-28 ENCOUNTER — Ambulatory Visit: Payer: 59 | Admitting: Family Medicine

## 2020-10-11 ENCOUNTER — Ambulatory Visit: Payer: 59 | Admitting: Family Medicine

## 2020-12-07 ENCOUNTER — Other Ambulatory Visit: Payer: Self-pay | Admitting: Neurology

## 2020-12-07 DIAGNOSIS — G43009 Migraine without aura, not intractable, without status migrainosus: Secondary | ICD-10-CM

## 2021-01-17 ENCOUNTER — Other Ambulatory Visit: Payer: Self-pay | Admitting: Neurology

## 2021-01-17 DIAGNOSIS — G43009 Migraine without aura, not intractable, without status migrainosus: Secondary | ICD-10-CM

## 2021-01-17 NOTE — Telephone Encounter (Signed)
CMM intiated (Key: [id number]) for Ubrelvy 50mg  tablets for PA. Determination pending.

## 2021-01-19 ENCOUNTER — Telehealth: Payer: Self-pay | Admitting: *Deleted

## 2021-01-19 NOTE — Telephone Encounter (Signed)
CMM KEY [id number] intiated fo ubrelvy 50mg  tabs. 8 tab/ 30 days.   Approved PA until 01-17-2022.  ID [id number]. [facility].  Last note stated it did not help his migraines.  It is there if he needs and wants it.

## 2021-01-19 NOTE — Telephone Encounter (Signed)
CMM KEY [id number] intiated fo ubrelvy 50mg tabs. 8 tab/ 30 days.   Approved PA [id number] until 01-17-2022.  ID [id number]. [facility].  Last note stated it did not help his migraines.  It is there if he needs and wants it.   

## 2021-01-31 ENCOUNTER — Telehealth: Payer: 59 | Admitting: Family Medicine

## 2021-08-03 ENCOUNTER — Other Ambulatory Visit: Payer: Self-pay | Admitting: Neurology

## 2021-08-03 DIAGNOSIS — G43009 Migraine without aura, not intractable, without status migrainosus: Secondary | ICD-10-CM

## 2021-09-16 ENCOUNTER — Other Ambulatory Visit: Payer: Self-pay | Admitting: Neurology

## 2021-09-16 DIAGNOSIS — G43009 Migraine without aura, not intractable, without status migrainosus: Secondary | ICD-10-CM

## 2021-09-20 ENCOUNTER — Telehealth: Payer: Self-pay | Admitting: Family Medicine

## 2021-09-20 NOTE — Telephone Encounter (Signed)
..   Pt understands that although there may be some limitations with this type of visit, we will take all precautions to reduce any security or privacy concerns.  Pt understands that this will be treated like an in office visit and we will file with pt's insurance, and there may be a patient responsible charge related to this service. ? ?

## 2021-09-23 ENCOUNTER — Other Ambulatory Visit: Payer: Self-pay | Admitting: Neurology

## 2021-09-23 DIAGNOSIS — G43009 Migraine without aura, not intractable, without status migrainosus: Secondary | ICD-10-CM

## 2021-10-09 NOTE — Patient Instructions (Signed)
Below is our plan:  We will continue gabapentin 100mg  up to three times daily and Ubrelvy as needed.   Please make sure you are staying well hydrated. I recommend 50-60 ounces daily. Well balanced diet and regular exercise encouraged. Consistent sleep schedule with 6-8 hours recommended.   Please continue follow up with care team as directed.   Follow up with me in  year   You may receive a survey regarding today's visit. I encourage you to leave honest feed back as I do use this information to improve patient care. Thank you for seeing me today!

## 2021-10-09 NOTE — Progress Notes (Unsigned)
PATIENT: Stephen Bass DOB: 16-Feb-2001  REASON FOR VISIT: follow up HISTORY FROM: patient  Virtual Visit via Telephone Note  I connected with Stephen Bass on 10/10/21 at  7:45 AM EDT by telephone and verified that I am speaking with the correct person using two identifiers.   I discussed the limitations, risks, security and privacy concerns of performing an evaluation and management service by telephone and the availability of in person appointments. I also discussed with the patient that there may be a patient responsible charge related to this service. The patient expressed understanding and agreed to proceed.   History of Present Illness:  10/10/21 ALL (Mychart): Stephen Bass is a 21 y.o. male here today for follow up for headaches. He was last seen 03/2020 and started on gabapentin 100mg  TID and tizanidine as needed for occipital headaches. 6 months follow up advised. Since, he reports doing very well. Headaches are well managed. He no longer uses tizanidine but has continued gabapentin up to TID PRN and Ubrelvy PRN. He knows that heat is a trigger. Headaches are worse during the summer.   03/29/20 ALL: Stephen Bass is a 21 y.o. male here today for follow up for headaches. He called to report worsening neck and occipital pain on 12/29. Medrol dosepak given that he feels may have helped a little but did not resolve pain. 1/30 does not help at all. He describes pain starting in the base of the left side of his head that radiates up to the left temple. Now pain is starting to effect left ear. Pain waxes and wanes. He has not been able to correlate any particular movements or activities that help or worsen pain. He reports that he has been lifting weights recently and feels that 2-3 days after starting is when pain started. His mother gave him one dose of gabapentin 300mg . He reports that he felt "really weak all over" and was not sure if it helped. He feels dose was too strong. He has chronic  neck pain from previous injury playing football. He does not think he has had imaging of his neck. He describes a muscle tension of the right trapezius that waxes and wanes. No radicular symptoms.    Observations/Objective:  Generalized: Well developed, in no acute distress  Mentation: Alert oriented to time, place, history taking. Follows all commands speech and language fluent   Assessment and Plan:  21 y.o. year old male  has a past medical history of Abdominal pain, Acid reflux, Asthma, Headache(784.0), IBS (irritable bowel syndrome), and Migraine. here with    ICD-10-CM   1. Bilateral occipital neuralgia  M54.81     2. Migraine without aura and without status migrainosus, not intractable  G43.009 Ubrogepant (UBRELVY) 50 MG TABS      Stephen Bass reports that headaches are well managed with gabapentin 100mg  up to TID PRN and Ubrelvy PRN. We will continue current treatment plan. He was encouraged to stay well hydrated and continue healthy lifestyle habits. Follow up in 1 year.   No orders of the defined types were placed in this encounter.   Meds ordered this encounter  Medications   gabapentin (NEURONTIN) 100 MG capsule    Sig: Take 1 capsule (100 mg total) by mouth 3 (three) times daily.    Dispense:  90 capsule    Refill:  11    Order Specific Question:   Supervising Provider    Answer:   21   Ubrogepant (UBRELVY) 50 MG TABS  Sig: TAKE 1-2 TABLETS BY MOUTH AT ONSET OF HEADACHE AND CAN REPEAT 2 HOURS LATER. MAX 200 MG PER DAY    Dispense:  8 tablet    Refill:  11    G43.009    Order Specific Question:   Supervising Provider    Answer:   Anson Fret [[id number]]     Follow Up Instructions:  I discussed the assessment and treatment plan with the patient. The patient was provided an opportunity to ask questions and all were answered. The patient agreed with the plan and demonstrated an understanding of the instructions.   The patient was advised  to call back or seek an in-person evaluation if the symptoms worsen or if the condition fails to improve as anticipated.  I provided 15 minutes of non-face-to-face time during this encounter. Patient located at their place of residence during Mychart visit. Provider is in the office.    Shawnie Dapper, NP

## 2021-10-10 ENCOUNTER — Telehealth (INDEPENDENT_AMBULATORY_CARE_PROVIDER_SITE_OTHER): Payer: 59 | Admitting: Family Medicine

## 2021-10-10 ENCOUNTER — Encounter: Payer: Self-pay | Admitting: Family Medicine

## 2021-10-10 DIAGNOSIS — G43009 Migraine without aura, not intractable, without status migrainosus: Secondary | ICD-10-CM

## 2021-10-10 DIAGNOSIS — M5481 Occipital neuralgia: Secondary | ICD-10-CM

## 2021-10-10 MED ORDER — GABAPENTIN 100 MG PO CAPS: 100.0000 mg | ORAL_CAPSULE | Freq: Three times a day (TID) | ORAL | 11 refills | Status: DC

## 2021-10-10 MED ORDER — UBRELVY 50 MG PO TABS: ORAL_TABLET | ORAL | 11 refills | Status: DC

## 2021-12-22 ENCOUNTER — Telehealth: Payer: Self-pay | Admitting: *Deleted

## 2021-12-22 NOTE — Telephone Encounter (Signed)
Submitted PA Ubrelvy on [facility]. Key: [id number]. Waiting on determination from [employer].

## 2021-12-26 NOTE — Telephone Encounter (Signed)
"  Request Reference Number: [id number]. UBRELVY TAB 50MG  is approved through 12/23/2022. Your patient may now fill this prescription and it will be covered."

## 2022-09-08 ENCOUNTER — Other Ambulatory Visit: Payer: Self-pay | Admitting: Family Medicine

## 2022-09-08 DIAGNOSIS — G43009 Migraine without aura, not intractable, without status migrainosus: Secondary | ICD-10-CM

## 2022-09-11 ENCOUNTER — Telehealth: Payer: Self-pay | Admitting: *Deleted

## 2022-09-11 ENCOUNTER — Telehealth: Payer: Self-pay

## 2022-09-11 ENCOUNTER — Other Ambulatory Visit (HOSPITAL_COMMUNITY): Payer: Self-pay

## 2022-09-11 NOTE — Telephone Encounter (Signed)
Received notice from pharmacy that PA Ubrelvy needed.

## 2022-09-11 NOTE — Telephone Encounter (Signed)
   PT last seen 09/2021-could not find recent documentation in chart updating the number of headaches the PT is having. Please advise.

## 2022-09-12 NOTE — Telephone Encounter (Signed)
Pharmacy Patient Advocate Encounter   Received notification from [employer] that prior authorization for Ubrelvy 50MG  tablets is required/requested.   PA submitted to [facility] via CoverMyMeds Key or (Medicaid) confirmation # [id number]  Status is pending

## 2022-09-12 NOTE — Telephone Encounter (Signed)
Stephen Bass patient said he has 3-5 migraines per month.

## 2022-09-12 NOTE — Telephone Encounter (Signed)
I called preferred number listed and pt mother answered. She has DPR access, I asked if she could ask patient to log in my chart and reply to my question sent. Pt mother said she is on her way home and will relay to patient.

## 2022-09-13 ENCOUNTER — Other Ambulatory Visit (HOSPITAL_COMMUNITY): Payer: Self-pay

## 2022-09-13 NOTE — Telephone Encounter (Signed)
Noted  

## 2022-09-13 NOTE — Telephone Encounter (Signed)
Pharmacy Patient Advocate Encounter  Prior Authorization for Bernita Raisin 50MG  tablets has been APPROVED by [facility] from 09/12/2022 to 09/12/2023.   PA # PA Case ID: [id number]  Copay is $0 per [facility] test claim.

## 2023-01-02 ENCOUNTER — Other Ambulatory Visit: Payer: Self-pay | Admitting: *Deleted

## 2023-01-02 DIAGNOSIS — G43009 Migraine without aura, not intractable, without status migrainosus: Secondary | ICD-10-CM

## 2023-01-02 MED ORDER — UBRELVY 50 MG PO TABS: ORAL_TABLET | ORAL | 0 refills | Status: DC

## 2023-01-02 NOTE — Telephone Encounter (Signed)
Last seen on 10/10/21 No follow up scheduled

## 2023-03-26 ENCOUNTER — Other Ambulatory Visit: Payer: Self-pay

## 2023-03-26 DIAGNOSIS — G43009 Migraine without aura, not intractable, without status migrainosus: Secondary | ICD-10-CM

## 2023-03-27 ENCOUNTER — Other Ambulatory Visit: Payer: Self-pay | Admitting: Family Medicine

## 2023-03-27 DIAGNOSIS — G43009 Migraine without aura, not intractable, without status migrainosus: Secondary | ICD-10-CM

## 2023-03-28 MED ORDER — UBRELVY 50 MG PO TABS: ORAL_TABLET | ORAL | 0 refills | Status: DC

## 2023-03-28 NOTE — Telephone Encounter (Signed)
Last seen on 10/10/21 No follow up scheduled

## 2023-04-01 ENCOUNTER — Encounter: Payer: Self-pay | Admitting: Family Medicine

## 2023-04-01 ENCOUNTER — Telehealth (INDEPENDENT_AMBULATORY_CARE_PROVIDER_SITE_OTHER): Payer: Self-pay | Admitting: Family Medicine

## 2023-04-01 DIAGNOSIS — G43009 Migraine without aura, not intractable, without status migrainosus: Secondary | ICD-10-CM | POA: Diagnosis not present

## 2023-04-01 DIAGNOSIS — M5481 Occipital neuralgia: Secondary | ICD-10-CM | POA: Diagnosis not present

## 2023-04-01 MED ORDER — GABAPENTIN 100 MG PO CAPS: 100.0000 mg | ORAL_CAPSULE | Freq: Three times a day (TID) | ORAL | 3 refills | Status: DC

## 2023-04-01 MED ORDER — UBRELVY 100 MG PO TABS: 100.0000 mg | ORAL_TABLET | Freq: Every day | ORAL | 11 refills | Status: DC | PRN

## 2023-04-01 NOTE — Patient Instructions (Signed)
 Below is our plan:  We will continue gabapentin  100mg  up to three times daily. We will increase Ubrelvy  to 100mg  as needed. Please take 1 tablet at onset of headache. May take 1 additional tablet in 2 hours if needed. Do not take more than 2 tablets in 24 hours or more than 10 in a month.   Discuss neck pain with PCP. You may want to see a spine specialist.   Please make sure you are staying well hydrated. I recommend 50-60 ounces daily. Well balanced diet and regular exercise encouraged. Consistent sleep schedule with 6-8 hours recommended.   Please continue follow up with care team as directed.   Follow up with me in 1 year   You may receive a survey regarding today's visit. I encourage you to leave honest feed back as I do use this information to improve patient care. Thank you for seeing me today!   GENERAL HEADACHE INFORMATION:   Natural supplements: Magnesium Oxide or Magnesium Glycinate 500 mg at bed (up to 800 mg daily) Coenzyme Q10 300 mg in AM Vitamin B2- 200 mg twice a day   Add 1 supplement at a time since even natural supplements can have undesirable side effects. You can sometimes buy supplements cheaper (especially Coenzyme Q10) at www.webmailguide.co.za or at [facility].  Migraine with aura: There is increased risk for stroke in women with migraine with aura and a contraindication for the combined contraceptive pill for use by women who have migraine with aura. The risk for women with migraine without aura is lower. However other risk factors like smoking are far more likely to increase stroke risk than migraine. There is a recommendation for no smoking and for the use of OCPs without estrogen such as progestogen only pills particularly for women with migraine with aura.SABRA People who have migraine headaches with auras may be 3 times more likely to have a stroke caused by a blood clot, compared to migraine patients who don't see auras. Women who take hormone-replacement therapy may be 30  percent more likely to suffer a clot-based stroke than women not taking medication containing estrogen. Other risk factors like smoking and high blood pressure may be  much more important.    Vitamins and herbs that show potential:   Magnesium: Magnesium (250 mg twice a day or 500 mg at bed) has a relaxant effect on smooth muscles such as blood vessels. Individuals suffering from frequent or daily headache usually have low magnesium levels which can be increase with daily supplementation of 400-750 mg. Three trials found 40-90% average headache reduction  when used as a preventative. Magnesium may help with headaches are aura, the best evidence for magnesium is for migraine with aura is its thought to stop the cortical spreading depression we believe is the pathophysiology of migraine aura.Magnesium also demonstrated the benefit in menstrually related migraine.  Magnesium is part of the messenger system in the serotonin cascade and it is a good muscle relaxant.  It is also useful for constipation which can be a side effect of other medications used to treat migraine. Good sources include nuts, whole grains, and tomatoes. Side Effects: loose stool/diarrhea  Riboflavin (vitamin B 2) 200 mg twice a day. This vitamin assists nerve cells in the production of ATP a principal energy storing molecule.  It is necessary for many chemical reactions in the body.  There have been at least 3 clinical trials of riboflavin using 400 mg per day all of which suggested that migraine frequency can  be decreased.  All 3 trials showed significant improvement in over half of migraine sufferers.  The supplement is found in bread, cereal, milk, meat, and poultry.  Most Americans get more riboflavin than the recommended daily allowance, however riboflavin deficiency is not necessary for the supplements to help prevent headache. Side effects: energizing, green urine   Coenzyme Q10: This is present in almost all cells in the body and is  critical component for the conversion of energy.  Recent studies have shown that a nutritional supplement of CoQ10 can reduce the frequency of migraine attacks by improving the energy production of cells as with riboflavin.  Doses of 150 mg twice a day have been shown to be effective.   Melatonin: Increasing evidence shows correlation between melatonin secretion and headache conditions.  Melatonin supplementation has decreased headache intensity and duration.  It is widely used as a sleep aid.  Sleep is natures way of dealing with migraine.  A dose of 3 mg is recommended to start for headaches including cluster headache. Higher doses up to 15 mg has been reviewed for use in Cluster headache and have been used. The rationale behind using melatonin for cluster is that many theories regarding the cause of Cluster headache center around the disruption of the normal circadian rhythm in the brain.  This helps restore the normal circadian rhythm.   HEADACHE DIET: Foods and beverages which may trigger migraine Note that only 20% of headache patients are food sensitive. You will know if you are food sensitive if you get a headache consistently 20 minutes to 2 hours after eating a certain food. Only cut out a food if it causes headaches, otherwise you might remove foods you enjoy! What matters most for diet is to eat a well balanced healthy diet full of vegetables and low fat protein, and to not miss meals.   Chocolate, other sweets ALL cheeses except cottage and cream cheese Dairy products, yogurt, sour cream, ice cream Liver Meat extracts (Bovril, Marmite, meat tenderizers) Meats or fish which have undergone aging, fermenting, pickling or smoking. These include: Hotdogs,salami,Lox,sausage, mortadellas,smoked salmon, pepperoni, Pickled herring Pods of broad bean (English beans, Chinese pea pods, Italian (fava) beans, lima and navy beans Ripe avocado, ripe banana Yeast extracts or active yeast preparations  such as Brewer's or Fleishman's (commercial bakes goods are permitted) Tomato based foods, pizza (lasagna, etc.)   MSG (monosodium glutamate) is disguised as many things; look for these common aliases: Monopotassium glutamate Autolysed yeast Hydrolysed protein Sodium caseinate "flavorings" "all natural preservatives Nutrasweet   Avoid all other foods that convincingly provoke headaches.   Resources: The Dizzy Bluford Aid Your Headache Diet, migrainestrong.com  https://zamora-andrews.com/   Caffeine  and Migraine For patients that have migraine, caffeine  intake more than 3 days per week can lead to dependency and increased migraine frequency. I would recommend cutting back on your caffeine  intake as best you can. The recommended amount of caffeine  is 200-300 mg daily, although migraine patients may experience dependency at even lower doses. While you may notice an increase in headache temporarily, cutting back will be helpful for headaches in the long run. For more information on caffeine  and migraine, visit: https://americanmigrainefoundation.org/resource-library/caffeine -and-migraine/   Headache Prevention Strategies:   1. Maintain a headache diary; learn to identify and avoid triggers.  - This can be a simple note where you log when you had a headache, associated symptoms, and medications used - There are several smartphone apps developed to help track migraines: Migraine Buddy, Migraine Monitor, Curelator N1-Headache  App   Common triggers include: Emotional triggers: Emotional/Upset family or friends Emotional/Upset occupation Business reversal/success Anticipation anxiety Crisis-serious Post-crisis periodNew job/position   Physical triggers: Vacation Day Weekend Strenuous Exercise High Altitude Location New Move Menstrual Day Physical Illness Oversleep/Not enough sleep Weather changes Light: Photophobia or light sesnitivity  treatment involves a balance between desensitization and reduction in overly strong input. Use dark polarized glasses outside, but not inside. Avoid bright or fluorescent light, but do not dim environment to the point that going into a normally lit room hurts. Consider FL-41 tint lenses, which reduce the most irritating wavelengths without blocking too much light.  These can be obtained at axonoptics.com or theraspecs.com Foods: see list above.   2. Limit use of acute treatments (over-the-counter medications, triptans, etc.) to no more than 2 days per week or 10 days per month to prevent medication overuse headache (rebound headache).     3. Follow a regular schedule (including weekends and holidays): Don't skip meals. Eat a balanced diet. 8 hours of sleep nightly. Minimize stress. Exercise 30 minutes per day. Being overweight is associated with a 5 times increased risk of chronic migraine. Keep well hydrated and drink 6-8 glasses of water per day.   4. Initiate non-pharmacologic measures at the earliest onset of your headache. Rest and quiet environment. Relax and reduce stress. Breathe2Relax is a free app that can instruct you on    some simple relaxtion and breathing techniques. Http://Dawnbuse.com is a    free website that provides teaching videos on relaxation.  Also, there are  many apps that   can be downloaded for "mindful" relaxation.  An app called YOGA NIDRA will help walk you through mindfulness. Another app called Calm can be downloaded to give you a structured mindfulness guide with daily reminders and skill development. Headspace for guided meditation Mindfulness Based Stress Reduction Online Course: www.palousemindfulness.com Cold compresses.   5. Don't wait!! Take the maximum allowable dosage of prescribed medication at the first sign of migraine.   6. Compliance:  Take prescribed medication regularly as directed and at the first sign of a migraine.   7. Communicate:  Call your  physician when problems arise, especially if your headaches change, increase in frequency/severity, or become associated with neurological symptoms (weakness, numbness, slurred speech, etc.). Proceed to emergency room if you experience new or worsening symptoms or symptoms do not resolve, if you have new neurologic symptoms or if headache is severe, or for any concerning symptom.   8. Headache/pain management therapies: Consider various complementary methods, including medication, behavioral therapy, psychological counselling, biofeedback, massage therapy, acupuncture, dry needling, and other modalities.  Such measures may reduce the need for medications. Counseling for pain management, where patients learn to function and ignore/minimize their pain, seems to work very well.   9. Recommend changing family's attention and focus away from patient's headaches. Instead, emphasize daily activities. If first question of day is 'How are your headaches/Do you have a headache today?', then patient will constantly think about headaches, thus making them worse. Goal is to re-direct attention away from headaches, toward daily activities and other distractions.   10. Helpful Websites: www.AmericanHeadacheSociety.org patenthood.ch www.headaches.org tightmarket.nl www.achenet.org

## 2023-04-01 NOTE — Progress Notes (Signed)
 PATIENT: Stephen Bass DOB: Feb 01, 2001  REASON FOR VISIT: follow up HISTORY FROM: patient  Virtual Visit via Telephone Note  I connected with Stephen Bass on 04/01/23 at 11:00 AM EST by telephone and verified that I am speaking with the correct person using two identifiers.   I discussed the limitations, risks, security and privacy concerns of performing an evaluation and management service by telephone and the availability of in person appointments. I also discussed with the patient that there may be a patient responsible charge related to this service. The patient expressed understanding and agreed to proceed.   History of Present Illness:  04/01/23 ALL (Mychart): Stephen Bass returns for follow up for occipital headaches. He was last seen 09/2021 and doing well on gabapentin  up to 100mg  TID and Ubrelvy . Since, he reports headaches are fairly stable. He has 9-10 headache days with 4-5 migrainous headaches. He has not taken gabapentin  as frequently over the past few months. He feels cold weather makes neck pain work which contributes to headaches. Warmer months he knows he has to stay hydrated. Ubrelvy  works well. He rarely has to use all 8 tablets but does typically have to take 2 tablets to abort headache.  10/10/2021 ALL (Mychart): Stephen Bass is a 23 y.o. male here today for follow up for headaches. He was last seen 03/2020 and started on gabapentin  100mg  TID and tizanidine  as needed for occipital headaches. 6 months follow up advised. Since, he reports doing very well. Headaches are well managed. He no longer uses tizanidine  but has continued gabapentin  up to TID PRN and Ubrelvy  PRN. He knows that heat is a trigger. Headaches are worse during the summer.   03/29/20 ALL: Stephen Bass is a 23 y.o. male here today for follow up for headaches. He called to report worsening neck and occipital pain on 12/29. Medrol  dosepak given that he feels may have helped a little but did not resolve pain. Ubrelvy   does not help at all. He describes pain starting in the base of the left side of his head that radiates up to the left temple. Now pain is starting to effect left ear. Pain waxes and wanes. He has not been able to correlate any particular movements or activities that help or worsen pain. He reports that he has been lifting weights recently and feels that 2-3 days after starting is when pain started. His mother gave him one dose of gabapentin  300mg . He reports that he felt really weak all over and was not sure if it helped. He feels dose was too strong. He has chronic neck pain from previous injury playing football. He does not think he has had imaging of his neck. He describes a muscle tension of the right trapezius that waxes and wanes. No radicular symptoms.    Observations/Objective:  Generalized: Well developed, in no acute distress  Mentation: Alert oriented to time, place, history taking. Follows all commands speech and language fluent   Assessment and Plan:  23 y.o. year old male  has a past medical history of Abdominal pain, Acid reflux, Asthma, Headache(784.0), IBS (irritable bowel syndrome), and Migraine. here with    ICD-10-CM   1. Migraine without aura and without status migrainosus, not intractable  G43.009     2. Bilateral occipital neuralgia  M54.81      Stephen Bass reports headaches are stable. He has chronic neck pain but no radicular symptoms. We will continue gabapentin  100mg  up to TID. We will increase Ubrelvy  to 100mg  as needed, not  to exceed two tablets in 24 hours. Healthy lifestyle habits encouraged. He will follow up in 1 year, sooner if needed. He verbalized understanding and agreement with this plan.  No orders of the defined types were placed in this encounter.   Meds ordered this encounter  Medications   Ubrogepant  (UBRELVY ) 100 MG TABS    Sig: Take 1 tablet (100 mg total) by mouth daily as needed. Take one tablet at onset of headache, may repeat 1 tablet in 2  hours, no more than 2 tablets in 24 hours    Dispense:  8 tablet    Refill:  11    Supervising Provider:   AHERN, ANTONIA B [[id number]]   gabapentin  (NEURONTIN ) 100 MG capsule    Sig: Take 1 capsule (100 mg total) by mouth 3 (three) times daily.    Dispense:  270 capsule    Refill:  3    Supervising Provider:   INES ONETHA NOVAK [[id number]]     Follow Up Instructions:  I discussed the assessment and treatment plan with the patient. The patient was provided an opportunity to ask questions and all were answered. The patient agreed with the plan and demonstrated an understanding of the instructions.   The patient was advised to call back or seek an in-person evaluation if the symptoms worsen or if the condition fails to improve as anticipated.  I provided 15 minutes of non-face-to-face time during this encounter. Patient located at their place of residence during Mychart visit. Provider is in the office.    Sanaai Doane, NP

## 2023-04-11 ENCOUNTER — Telehealth: Payer: Self-pay | Admitting: Family Medicine

## 2023-04-11 NOTE — Telephone Encounter (Signed)
Pt's mother called to provide pt's new insurance information  [employer] Member WU#[id number] Plan Code 425 RxBin#[id number] RxPCN#[id number] RxGRP#[id number] Provider Service line#[phone]

## 2023-04-11 NOTE — Telephone Encounter (Signed)
Noted  

## 2023-04-12 ENCOUNTER — Telehealth: Payer: Self-pay

## 2023-04-12 ENCOUNTER — Other Ambulatory Visit (HOSPITAL_COMMUNITY): Payer: Self-pay

## 2023-04-12 NOTE — Telephone Encounter (Signed)
Pharmacy Patient Advocate Encounter   Received notification from Physician's Office that prior authorization for Ubrelvy 100MG  tablets is required/requested.   Insurance verification completed.   The patient is insured through [employer] .   Per test claim: PA required; PA submitted to above mentioned insurance via CoverMyMeds Key/confirmation #/EOC [id number] Status is pending

## 2023-04-12 NOTE — Telephone Encounter (Signed)
PA request has been Submitted. New Encounter created for follow up. For additional info see Pharmacy Prior Auth telephone encounter from 04/12/2023.

## 2023-04-29 ENCOUNTER — Other Ambulatory Visit (HOSPITAL_COMMUNITY): Payer: Self-pay

## 2023-04-29 NOTE — Telephone Encounter (Signed)
I called Insurance per the info provided in [place]-I was transferred 4 different times-spent a total of 54 mins-I was not able to verify where they were with this PA information due tp the fact that the zip code in chart is not the same as what insurance has listed and then they stated if I had the insurance card they could ask me some sponsor questions (Not who the insurance name is in but info on the card about the actual plan)-I explained we do not have the actual card scanned into the chart and ran by her the info provided in the chart message and the rep stated she could not help me further due to not having the needed information. Please have PT scan insurance card into the chart via mychart or via coming in and scanning-also PT may want to call insurance and update their zip code to either match what we have [place] [id number]. PA can not be followed up on at this time.

## 2023-04-30 NOTE — Telephone Encounter (Signed)
 Noted

## 2023-04-30 NOTE — Telephone Encounter (Signed)
Mother called, remember patient has his own insurance, Patient is in [place] right now and he has his card. Informed mother patient can take a picture front and back. Can up1oad to MyChart. Mother said will call him to let him know send through MyChart.

## 2023-04-30 NOTE — Telephone Encounter (Signed)
 Called and spoke w/ mother. He is under fathers insurance. Father works in [place] and she will have him stop by to scan in front/back of insurance card. I provided office hours. Monday-Thurs 8-5pm and Fri 8-12pm. Asked her to tell him to have front desk send nurses message once they scan in so we can be sure to message PA team. She verbalized understanding.

## 2023-05-01 NOTE — Telephone Encounter (Signed)
 Stephen Bass, pt uploaded insurance card today, thank you

## 2023-05-01 NOTE — Telephone Encounter (Signed)
 Called mother. Made her aware son has not uploaded insurance card yet. She will f/u with him to have him upload. I did advise it can take time for PA team to submit and insurance to review. Recommended he upload at his earliest convenience.

## 2023-05-03 ENCOUNTER — Other Ambulatory Visit (HOSPITAL_COMMUNITY): Payer: Self-pay

## 2023-05-03 NOTE — Telephone Encounter (Addendum)
 I called insurance-[employer]- [phone] The previous PA had been closed out by the payor. I submitted new PA over the phone-72 hour turnaround time, Faxed clinicals to [phone] Awaiting determination.  RX Case# [id number]

## 2023-05-03 NOTE — Telephone Encounter (Signed)
 Pharmacy Patient Advocate Encounter  Received notification from [facility] that Prior Authorization for Ubrelvy  100mg  tablet has been DENIED.  Full denial letter will be uploaded to the media tab. See denial reason below.     PA #/Case ID/Reference #: [id number]  Will refer to our appeals pharmacist for review.

## 2023-05-06 ENCOUNTER — Telehealth: Payer: Self-pay | Admitting: Pharmacist

## 2023-05-06 NOTE — Telephone Encounter (Signed)
 Appeal has been submitted for Ubrelvy . Will advise when response is received or follow up in 1 week. Please be advised that most companies may take 30 days to make a decision. Appeal letter and supporting documentation have been faxed to [phone] on 02/10/205 @ 12:00pm.  Thank you, Dene Fines, PharmD Clinical Pharmacist  [place]  Direct Dial: [phone]

## 2023-05-06 NOTE — Telephone Encounter (Signed)
 Thank you for forwarding to appeals. I looked back and see he has failed Imitrex , Maxalt , Relpax 

## 2023-05-13 NOTE — Telephone Encounter (Signed)
Called and spoke with mother. She states Stephen Bass did let her know appeal sent and pending decision by insurance. Aware we will send to PA team to see if they have gotten any updates. We will f/u with them as soon as we hear from insurance. She verbalized understanding.

## 2023-05-13 NOTE — Telephone Encounter (Signed)
Pt's mother, Markell Sciascia (on last DPR on file) said the insurance company will not cover Ubrelvy. Have to prove patient has tried other medication that have not worked. Would like a call back.

## 2023-05-15 ENCOUNTER — Other Ambulatory Visit (HOSPITAL_COMMUNITY): Payer: Self-pay

## 2023-05-20 ENCOUNTER — Other Ambulatory Visit (HOSPITAL_COMMUNITY): Payer: Self-pay

## 2023-05-21 ENCOUNTER — Encounter: Payer: Self-pay | Admitting: Pharmacist

## 2023-05-21 ENCOUNTER — Other Ambulatory Visit (HOSPITAL_COMMUNITY): Payer: Self-pay

## 2023-05-30 ENCOUNTER — Other Ambulatory Visit (HOSPITAL_COMMUNITY): Payer: Self-pay

## 2023-06-03 ENCOUNTER — Other Ambulatory Visit (HOSPITAL_COMMUNITY): Payer: Self-pay

## 2023-06-03 NOTE — Telephone Encounter (Signed)
 Called mother back. Relayed appeal team was trying to reach them to confirm zipcode in order to get update on appeal. States pt address currently:  [address] [address]

## 2023-06-03 NOTE — Telephone Encounter (Signed)
 Pt's mother is asking for a call to discuss where things stand with the Ubrelvy for pt.

## 2023-06-04 ENCOUNTER — Telehealth: Payer: Self-pay | Admitting: Pharmacist

## 2023-06-04 NOTE — Telephone Encounter (Signed)
 Per insurance, appeal for Stephen Bass has been approved.

## 2023-06-04 NOTE — Telephone Encounter (Addendum)
 Called mother and updated her about approval. She verbalized understanding and appreciation.

## 2023-06-04 NOTE — Telephone Encounter (Signed)
 Called mother and updated her about approval. She verbalized understanding and appreciation.

## 2023-09-02 ENCOUNTER — Other Ambulatory Visit (HOSPITAL_COMMUNITY): Payer: Self-pay

## 2023-11-11 ENCOUNTER — Other Ambulatory Visit: Payer: Self-pay

## 2023-11-12 ENCOUNTER — Telehealth: Payer: Self-pay | Admitting: Family Medicine

## 2023-11-12 MED ORDER — UBRELVY 100 MG PO TABS: 100.0000 mg | ORAL_TABLET | Freq: Every day | ORAL | 11 refills | Status: DC | PRN

## 2023-11-12 NOTE — Telephone Encounter (Signed)
 refilled

## 2023-11-12 NOTE — Telephone Encounter (Signed)
 Pt mother called to request medication refill  Ubrogepant  (UBRELVY ) 100 MG TABS   Pt medication is to be sent to   [id number], [place] - [address] AT [facility] (Ph: [phone])

## 2024-03-16 ENCOUNTER — Other Ambulatory Visit: Payer: Self-pay | Admitting: *Deleted

## 2024-03-16 MED ORDER — GABAPENTIN 100 MG PO CAPS: 100.0000 mg | ORAL_CAPSULE | Freq: Three times a day (TID) | ORAL | 0 refills | Status: AC

## 2024-03-16 MED ORDER — UBRELVY 100 MG PO TABS: 100.0000 mg | ORAL_TABLET | Freq: Every day | ORAL | 1 refills | Status: AC | PRN

## 2024-03-16 NOTE — Telephone Encounter (Signed)
 Last seen on 04/01/23 No 1 year follow up scheduled
